# Patient Record
Sex: Male | Born: 1955 | Race: White | Hispanic: Yes | Marital: Married | Smoking: Never smoker
Health system: Southern US, Community
[De-identification: ages and names within clinical notes are randomized; demographics above are authoritative.]

## PROBLEM LIST (undated history)

## (undated) DIAGNOSIS — T7840XA Allergy, unspecified, initial encounter: Secondary | ICD-10-CM

## (undated) DIAGNOSIS — M7730 Calcaneal spur, unspecified foot: Secondary | ICD-10-CM

## (undated) DIAGNOSIS — G43909 Migraine, unspecified, not intractable, without status migrainosus: Secondary | ICD-10-CM

## (undated) DIAGNOSIS — Z860100 Personal history of colon polyps, unspecified: Secondary | ICD-10-CM

## (undated) DIAGNOSIS — M67919 Unspecified disorder of synovium and tendon, unspecified shoulder: Secondary | ICD-10-CM

## (undated) DIAGNOSIS — M719 Bursopathy, unspecified: Secondary | ICD-10-CM

## (undated) DIAGNOSIS — B351 Tinea unguium: Secondary | ICD-10-CM

## (undated) HISTORY — DX: Bursopathy, unspecified: M71.9

## (undated) HISTORY — DX: Tinea unguium: B35.1

## (undated) HISTORY — DX: Personal history of colonic polyps: Z86.010

## (undated) HISTORY — PX: TONSILLECTOMY AND ADENOIDECTOMY: SHX28

## (undated) HISTORY — DX: Migraine, unspecified, not intractable, without status migrainosus: G43.909

## (undated) HISTORY — DX: Allergy, unspecified, initial encounter: T78.40XA

## (undated) HISTORY — PX: HERNIA REPAIR: SHX51

## (undated) HISTORY — DX: Personal history of colon polyps, unspecified: Z86.0100

## (undated) HISTORY — DX: Unspecified disorder of synovium and tendon, unspecified shoulder: M67.919

## (undated) HISTORY — DX: Calcaneal spur, unspecified foot: M77.30

## (undated) HISTORY — PX: APPENDECTOMY: SHX54

## (undated) HISTORY — PX: VASECTOMY: SHX75

---

## 2003-03-14 ENCOUNTER — Ambulatory Visit (HOSPITAL_COMMUNITY): Admission: RE | Admit: 2003-03-14 | Discharge: 2003-03-14 | Payer: Self-pay | Admitting: Urology

## 2004-11-21 ENCOUNTER — Ambulatory Visit: Payer: Self-pay | Admitting: Internal Medicine

## 2004-11-30 ENCOUNTER — Ambulatory Visit: Payer: Self-pay | Admitting: Internal Medicine

## 2004-12-24 ENCOUNTER — Ambulatory Visit: Payer: Self-pay | Admitting: Internal Medicine

## 2004-12-25 ENCOUNTER — Ambulatory Visit (HOSPITAL_BASED_OUTPATIENT_CLINIC_OR_DEPARTMENT_OTHER): Admission: RE | Admit: 2004-12-25 | Discharge: 2004-12-25 | Payer: Self-pay | Admitting: Internal Medicine

## 2004-12-26 ENCOUNTER — Ambulatory Visit: Payer: Self-pay | Admitting: Pulmonary Disease

## 2005-01-03 ENCOUNTER — Ambulatory Visit: Payer: Self-pay | Admitting: Gastroenterology

## 2005-01-11 ENCOUNTER — Ambulatory Visit: Payer: Self-pay | Admitting: Internal Medicine

## 2005-01-17 ENCOUNTER — Ambulatory Visit: Payer: Self-pay | Admitting: Internal Medicine

## 2005-08-06 ENCOUNTER — Ambulatory Visit: Payer: Self-pay | Admitting: Internal Medicine

## 2005-08-16 ENCOUNTER — Ambulatory Visit: Payer: Self-pay | Admitting: Internal Medicine

## 2005-11-29 ENCOUNTER — Ambulatory Visit: Payer: Self-pay | Admitting: Internal Medicine

## 2006-06-26 ENCOUNTER — Ambulatory Visit: Payer: Self-pay | Admitting: Internal Medicine

## 2007-06-25 ENCOUNTER — Ambulatory Visit: Payer: Self-pay | Admitting: Internal Medicine

## 2007-06-25 LAB — CONVERTED CEMR LAB
ALT: 23 units/L (ref 0–53)
AST: 17 units/L (ref 0–37)
Albumin: 3.6 g/dL (ref 3.5–5.2)
Alkaline Phosphatase: 67 units/L (ref 39–117)
BUN: 14 mg/dL (ref 6–23)
Basophils Absolute: 0 10*3/uL (ref 0.0–0.1)
Basophils Relative: 0.4 % (ref 0.0–1.0)
Bilirubin Urine: NEGATIVE
Bilirubin, Direct: 0.2 mg/dL (ref 0.0–0.3)
CO2: 29 meq/L (ref 19–32)
Calcium: 9.1 mg/dL (ref 8.4–10.5)
Chloride: 105 meq/L (ref 96–112)
Cholesterol: 175 mg/dL (ref 0–200)
Creatinine, Ser: 1 mg/dL (ref 0.4–1.5)
Eosinophils Absolute: 0.1 10*3/uL (ref 0.0–0.6)
Eosinophils Relative: 1.9 % (ref 0.0–5.0)
GFR calc Af Amer: 101 mL/min
GFR calc non Af Amer: 83 mL/min
Glucose, Bld: 95 mg/dL (ref 70–99)
Glucose, Urine, Semiquant: NEGATIVE
HCT: 42.2 % (ref 39.0–52.0)
HDL: 26.1 mg/dL — ABNORMAL LOW (ref >39.0)
Hemoglobin: 14 g/dL (ref 13.0–17.0)
Ketones, urine, test strip: NEGATIVE
LDL Cholesterol: 109 mg/dL — ABNORMAL HIGH (ref 0–99)
Lymphocytes Relative: 34.8 % (ref 12.0–46.0)
MCHC: 33.3 g/dL (ref 30.0–36.0)
MCV: 85.4 fL (ref 78.0–100.0)
Monocytes Absolute: 0.5 10*3/uL (ref 0.2–0.7)
Monocytes Relative: 7.8 % (ref 3.0–11.0)
Neutro Abs: 3.8 10*3/uL (ref 1.4–7.7)
Neutrophils Relative %: 55.1 % (ref 43.0–77.0)
Nitrite: NEGATIVE
PSA: 1.78 ng/mL (ref 0.10–4.00)
Platelets: 185 10*3/uL (ref 150–400)
Potassium: 4.6 meq/L (ref 3.5–5.1)
Protein, U semiquant: NEGATIVE
RBC: 4.94 M/uL (ref 4.22–5.81)
RDW: 12.9 % (ref 11.5–14.6)
Sodium: 141 meq/L (ref 135–145)
Specific Gravity, Urine: 1.02
TSH: 0.71 microintl units/mL (ref 0.35–5.50)
Total Bilirubin: 1.1 mg/dL (ref 0.3–1.2)
Total CHOL/HDL Ratio: 6.7
Total Protein: 6.3 g/dL (ref 6.0–8.3)
Triglycerides: 199 mg/dL — ABNORMAL HIGH (ref 0–149)
Urobilinogen, UA: 0.2
VLDL: 40 mg/dL (ref 0–40)
WBC Urine, dipstick: NEGATIVE
WBC: 6.8 10*3/uL (ref 4.5–10.5)
pH: 6.5

## 2007-07-01 DIAGNOSIS — G4733 Obstructive sleep apnea (adult) (pediatric): Secondary | ICD-10-CM | POA: Insufficient documentation

## 2007-07-01 DIAGNOSIS — E785 Hyperlipidemia, unspecified: Secondary | ICD-10-CM

## 2007-07-01 DIAGNOSIS — Z86010 Personal history of colonic polyps: Secondary | ICD-10-CM

## 2007-07-01 DIAGNOSIS — J309 Allergic rhinitis, unspecified: Secondary | ICD-10-CM | POA: Insufficient documentation

## 2007-07-02 ENCOUNTER — Ambulatory Visit: Payer: Self-pay | Admitting: Internal Medicine

## 2007-07-29 ENCOUNTER — Ambulatory Visit: Payer: Self-pay | Admitting: Internal Medicine

## 2007-07-29 DIAGNOSIS — F988 Other specified behavioral and emotional disorders with onset usually occurring in childhood and adolescence: Secondary | ICD-10-CM

## 2007-07-29 DIAGNOSIS — M67919 Unspecified disorder of synovium and tendon, unspecified shoulder: Secondary | ICD-10-CM

## 2007-07-29 DIAGNOSIS — M719 Bursopathy, unspecified: Secondary | ICD-10-CM

## 2007-07-29 HISTORY — DX: Unspecified disorder of synovium and tendon, unspecified shoulder: M67.919

## 2007-09-02 ENCOUNTER — Telehealth (INDEPENDENT_AMBULATORY_CARE_PROVIDER_SITE_OTHER): Payer: Self-pay | Admitting: *Deleted

## 2007-11-03 ENCOUNTER — Ambulatory Visit: Payer: Self-pay | Admitting: Internal Medicine

## 2007-11-03 DIAGNOSIS — T887XXA Unspecified adverse effect of drug or medicament, initial encounter: Secondary | ICD-10-CM | POA: Insufficient documentation

## 2007-11-03 LAB — CONVERTED CEMR LAB
ALT: 27 units/L (ref 0–53)
AST: 20 units/L (ref 0–37)
Albumin: 3.5 g/dL (ref 3.5–5.2)
Alkaline Phosphatase: 74 units/L (ref 39–117)
Bilirubin, Direct: 0.1 mg/dL (ref 0.0–0.3)
Cholesterol: 198 mg/dL (ref 0–200)
Direct LDL: 128.1 mg/dL
HDL: 27.7 mg/dL — ABNORMAL LOW (ref >39.0)
Total Bilirubin: 0.8 mg/dL (ref 0.3–1.2)
Total CHOL/HDL Ratio: 7.1
Total Protein: 6.4 g/dL (ref 6.0–8.3)
Triglycerides: 234 mg/dL (ref 0–149)
VLDL: 47 mg/dL — ABNORMAL HIGH (ref 0–40)

## 2007-11-12 ENCOUNTER — Ambulatory Visit: Payer: Self-pay | Admitting: Internal Medicine

## 2007-11-12 DIAGNOSIS — B351 Tinea unguium: Secondary | ICD-10-CM

## 2007-11-12 HISTORY — DX: Tinea unguium: B35.1

## 2007-12-22 ENCOUNTER — Telehealth: Payer: Self-pay | Admitting: Internal Medicine

## 2008-02-04 ENCOUNTER — Ambulatory Visit: Payer: Self-pay | Admitting: Internal Medicine

## 2008-02-04 LAB — CONVERTED CEMR LAB
ALT: 26 units/L (ref 0–53)
AST: 18 units/L (ref 0–37)
Albumin: 3.7 g/dL (ref 3.5–5.2)
Alkaline Phosphatase: 67 units/L (ref 39–117)
Bilirubin, Direct: 0.1 mg/dL (ref 0.0–0.3)
Total Bilirubin: 0.8 mg/dL (ref 0.3–1.2)
Total Protein: 6.6 g/dL (ref 6.0–8.3)

## 2008-02-11 ENCOUNTER — Ambulatory Visit: Payer: Self-pay | Admitting: Internal Medicine

## 2008-02-11 DIAGNOSIS — E8881 Metabolic syndrome: Secondary | ICD-10-CM

## 2008-07-05 ENCOUNTER — Ambulatory Visit: Payer: Self-pay | Admitting: Internal Medicine

## 2008-07-05 LAB — CONVERTED CEMR LAB
ALT: 28 units/L (ref 0–53)
AST: 21 units/L (ref 0–37)
Albumin: 3.7 g/dL (ref 3.5–5.2)
Alkaline Phosphatase: 62 units/L (ref 39–117)
BUN: 20 mg/dL (ref 6–23)
Basophils Absolute: 0.1 10*3/uL (ref 0.0–0.1)
Basophils Relative: 0.8 % (ref 0.0–3.0)
Bilirubin Urine: NEGATIVE
Bilirubin, Direct: 0.1 mg/dL (ref 0.0–0.3)
CO2: 28 meq/L (ref 19–32)
Calcium: 9.2 mg/dL (ref 8.4–10.5)
Chloride: 104 meq/L (ref 96–112)
Cholesterol: 205 mg/dL (ref 0–200)
Creatinine, Ser: 1 mg/dL (ref 0.4–1.5)
Direct LDL: 130.1 mg/dL
Eosinophils Absolute: 0.1 10*3/uL (ref 0.0–0.7)
Eosinophils Relative: 1.3 % (ref 0.0–5.0)
GFR calc Af Amer: 100 mL/min
GFR calc non Af Amer: 83 mL/min
Glucose, Bld: 96 mg/dL (ref 70–99)
Glucose, Urine, Semiquant: NEGATIVE
HCT: 44 % (ref 39.0–52.0)
HDL: 32.4 mg/dL — ABNORMAL LOW (ref >39.0)
Hemoglobin: 14.9 g/dL (ref 13.0–17.0)
Ketones, urine, test strip: NEGATIVE
Lymphocytes Relative: 31.7 % (ref 12.0–46.0)
MCHC: 33.9 g/dL (ref 30.0–36.0)
MCV: 85.6 fL (ref 78.0–100.0)
Monocytes Absolute: 0.5 10*3/uL (ref 0.1–1.0)
Monocytes Relative: 6.3 % (ref 3.0–12.0)
Neutro Abs: 4.6 10*3/uL (ref 1.4–7.7)
Neutrophils Relative %: 59.9 % (ref 43.0–77.0)
Nitrite: NEGATIVE
PSA: 1.79 ng/mL (ref 0.10–4.00)
Platelets: 156 10*3/uL (ref 150–400)
Potassium: 4.1 meq/L (ref 3.5–5.1)
Protein, U semiquant: NEGATIVE
RBC: 5.13 M/uL (ref 4.22–5.81)
RDW: 13.2 % (ref 11.5–14.6)
Sodium: 138 meq/L (ref 135–145)
Specific Gravity, Urine: 1.025
TSH: 1.07 microintl units/mL (ref 0.35–5.50)
Total Bilirubin: 1.1 mg/dL (ref 0.3–1.2)
Total CHOL/HDL Ratio: 6.3
Total Protein: 6.8 g/dL (ref 6.0–8.3)
Triglycerides: 183 mg/dL — ABNORMAL HIGH (ref 0–149)
Urobilinogen, UA: 0.2
VLDL: 37 mg/dL (ref 0–40)
WBC Urine, dipstick: NEGATIVE
WBC: 7.7 10*3/uL (ref 4.5–10.5)
pH: 5.5

## 2008-07-12 ENCOUNTER — Ambulatory Visit: Payer: Self-pay | Admitting: Internal Medicine

## 2008-10-05 ENCOUNTER — Ambulatory Visit: Payer: Self-pay | Admitting: Internal Medicine

## 2008-10-05 LAB — CONVERTED CEMR LAB
ALT: 22 units/L (ref 0–53)
AST: 20 units/L (ref 0–37)
Albumin: 3.6 g/dL (ref 3.5–5.2)
Alkaline Phosphatase: 68 units/L (ref 39–117)
Bilirubin, Direct: 0 mg/dL (ref 0.0–0.3)
Cholesterol: 167 mg/dL (ref 0–200)
HDL: 35.2 mg/dL — ABNORMAL LOW (ref >39.00)
LDL Cholesterol: 104 mg/dL — ABNORMAL HIGH (ref 0–99)
Total Bilirubin: 1 mg/dL (ref 0.3–1.2)
Total CHOL/HDL Ratio: 5
Total Protein: 6.8 g/dL (ref 6.0–8.3)
Triglycerides: 140 mg/dL (ref 0.0–149.0)
VLDL: 28 mg/dL (ref 0.0–40.0)

## 2008-10-12 ENCOUNTER — Ambulatory Visit: Payer: Self-pay | Admitting: Internal Medicine

## 2008-10-12 LAB — CONVERTED CEMR LAB
Cholesterol, target level: 200 mg/dL
HDL goal, serum: 40 mg/dL
LDL Goal: 100 mg/dL

## 2008-10-21 ENCOUNTER — Telehealth (INDEPENDENT_AMBULATORY_CARE_PROVIDER_SITE_OTHER): Payer: Self-pay | Admitting: *Deleted

## 2009-01-09 DIAGNOSIS — E669 Obesity, unspecified: Secondary | ICD-10-CM

## 2009-01-09 DIAGNOSIS — E66812 Obesity, class 2: Secondary | ICD-10-CM | POA: Insufficient documentation

## 2009-02-14 ENCOUNTER — Ambulatory Visit: Payer: Self-pay | Admitting: Internal Medicine

## 2009-02-14 LAB — CONVERTED CEMR LAB
ALT: 23 units/L (ref 0–53)
AST: 18 units/L (ref 0–37)
Albumin: 3.7 g/dL (ref 3.5–5.2)
Alkaline Phosphatase: 77 units/L (ref 39–117)
Bilirubin, Direct: 0 mg/dL (ref 0.0–0.3)
Cholesterol: 172 mg/dL (ref 0–200)
HDL: 33.5 mg/dL — ABNORMAL LOW (ref >39.00)
LDL Cholesterol: 103 mg/dL — ABNORMAL HIGH (ref 0–99)
Total Bilirubin: 0.8 mg/dL (ref 0.3–1.2)
Total CHOL/HDL Ratio: 5
Total Protein: 7.3 g/dL (ref 6.0–8.3)
Triglycerides: 176 mg/dL — ABNORMAL HIGH (ref 0.0–149.0)
VLDL: 35.2 mg/dL (ref 0.0–40.0)

## 2009-02-21 ENCOUNTER — Ambulatory Visit: Payer: Self-pay | Admitting: Internal Medicine

## 2009-04-06 ENCOUNTER — Telehealth: Payer: Self-pay | Admitting: Internal Medicine

## 2009-06-19 ENCOUNTER — Ambulatory Visit: Payer: Self-pay | Admitting: Internal Medicine

## 2009-06-19 LAB — CONVERTED CEMR LAB
ALT: 26 units/L (ref 0–53)
AST: 23 units/L (ref 0–37)
Albumin: 3.6 g/dL (ref 3.5–5.2)
Alkaline Phosphatase: 72 units/L (ref 39–117)
Bilirubin, Direct: 0.1 mg/dL (ref 0.0–0.3)
Cholesterol: 128 mg/dL (ref 0–200)
HDL: 40 mg/dL (ref >39.00)
LDL Cholesterol: 59 mg/dL (ref 0–99)
Total Bilirubin: 0.6 mg/dL (ref 0.3–1.2)
Total CHOL/HDL Ratio: 3
Total Protein: 7 g/dL (ref 6.0–8.3)
Triglycerides: 147 mg/dL (ref 0.0–149.0)
VLDL: 29.4 mg/dL (ref 0.0–40.0)

## 2009-06-26 ENCOUNTER — Ambulatory Visit: Payer: Self-pay | Admitting: Internal Medicine

## 2009-10-31 ENCOUNTER — Telehealth: Payer: Self-pay | Admitting: *Deleted

## 2009-11-21 ENCOUNTER — Ambulatory Visit: Payer: Self-pay | Admitting: Internal Medicine

## 2009-11-21 LAB — CONVERTED CEMR LAB
ALT: 22 units/L (ref 0–53)
AST: 18 units/L (ref 0–37)
Albumin: 3.8 g/dL (ref 3.5–5.2)
Alkaline Phosphatase: 69 units/L (ref 39–117)
BUN: 16 mg/dL (ref 6–23)
Basophils Absolute: 0 10*3/uL (ref 0.0–0.1)
Basophils Relative: 0.4 % (ref 0.0–3.0)
Bilirubin Urine: NEGATIVE
Bilirubin, Direct: 0.2 mg/dL (ref 0.0–0.3)
CO2: 29 meq/L (ref 19–32)
Calcium: 8.8 mg/dL (ref 8.4–10.5)
Chloride: 106 meq/L (ref 96–112)
Cholesterol: 143 mg/dL (ref 0–200)
Creatinine, Ser: 1.1 mg/dL (ref 0.4–1.5)
Eosinophils Absolute: 0.1 10*3/uL (ref 0.0–0.7)
Eosinophils Relative: 1 % (ref 0.0–5.0)
GFR calc non Af Amer: 76.78 mL/min (ref >60)
Glucose, Bld: 101 mg/dL — ABNORMAL HIGH (ref 70–99)
Glucose, Urine, Semiquant: NEGATIVE
HCT: 40.9 % (ref 39.0–52.0)
HDL: 33.8 mg/dL — ABNORMAL LOW (ref >39.00)
Hemoglobin: 13.9 g/dL (ref 13.0–17.0)
Ketones, urine, test strip: NEGATIVE
LDL Cholesterol: 70 mg/dL (ref 0–99)
Lymphocytes Relative: 34.4 % (ref 12.0–46.0)
Lymphs Abs: 2.6 10*3/uL (ref 0.7–4.0)
MCHC: 34.2 g/dL (ref 30.0–36.0)
MCV: 87 fL (ref 78.0–100.0)
Monocytes Absolute: 0.5 10*3/uL (ref 0.1–1.0)
Monocytes Relative: 6.3 % (ref 3.0–12.0)
Neutro Abs: 4.4 10*3/uL (ref 1.4–7.7)
Neutrophils Relative %: 57.9 % (ref 43.0–77.0)
Nitrite: NEGATIVE
PSA: 1.95 ng/mL (ref 0.10–4.00)
Platelets: 170 10*3/uL (ref 150.0–400.0)
Potassium: 4.4 meq/L (ref 3.5–5.1)
Protein, U semiquant: NEGATIVE
RBC: 4.7 M/uL (ref 4.22–5.81)
RDW: 13.3 % (ref 11.5–14.6)
Sodium: 139 meq/L (ref 135–145)
Specific Gravity, Urine: 1.025
TSH: 0.94 microintl units/mL (ref 0.35–5.50)
Total Bilirubin: 0.9 mg/dL (ref 0.3–1.2)
Total CHOL/HDL Ratio: 4
Total Protein: 6.6 g/dL (ref 6.0–8.3)
Triglycerides: 198 mg/dL — ABNORMAL HIGH (ref 0.0–149.0)
Urobilinogen, UA: 0.2
VLDL: 39.6 mg/dL (ref 0.0–40.0)
WBC Urine, dipstick: NEGATIVE
WBC: 7.7 10*3/uL (ref 4.5–10.5)
pH: 5.5

## 2009-12-11 ENCOUNTER — Ambulatory Visit: Payer: Self-pay | Admitting: Internal Medicine

## 2010-03-20 ENCOUNTER — Ambulatory Visit: Payer: Self-pay | Admitting: Internal Medicine

## 2010-03-20 DIAGNOSIS — M7730 Calcaneal spur, unspecified foot: Secondary | ICD-10-CM

## 2010-03-20 HISTORY — DX: Calcaneal spur, unspecified foot: M77.30

## 2010-05-19 ENCOUNTER — Encounter: Payer: Self-pay | Admitting: Internal Medicine

## 2010-05-27 LAB — CONVERTED CEMR LAB
Cholesterol, target level: 200 mg/dL
HDL goal, serum: 40 mg/dL
LDL Goal: 130 mg/dL

## 2010-05-29 NOTE — Progress Notes (Signed)
Summary: REFILL REQUEST  Phone Note Refill Request Message from:  Patient on October 31, 2009 4:57 PM  Refills Requested: Medication #1:  ADDERALL XR 20 MG  CP24 one by mouth q AM   Notes: Pt can be reached at [phone]   or   [phone].... Pt would like to have 3 mth / 90-days Rx.    Initial call taken by: Debbra Riding,  October 31, 2009 4:59 PM  Follow-up for Phone Call        may refill Follow-up by: Stacie Glaze MD,  November 01, 2009 8:04 AM

## 2010-05-29 NOTE — Assessment & Plan Note (Signed)
Summary: CPX/NJR [facility] BMP/NJR   Vital Signs:  Patient profile:   55 year old male Height:      72 inches Weight:      274 pounds BMI:     37.30 Temp:     98.2 degrees F oral Pulse rate:   76 / minute Resp:     14 per minute BP sitting:   136 / 80  (left arm)  Vitals Entered By: Willy Eddy, LPN (December 11, 2009 3:10 PM)  Nutrition Counseling: Patient's BMI is greater than 25 and therefore counseled on weight management options. CC: cpx- Is Patient Diabetic? No   CC:  cpx-.  History of Present Illness: The pt was asked about all immunizations, health maint. services that are appropriate to their age and was given guidance on diet exercize  and weight management   Preventive Screening-Counseling & Management  Alcohol-Tobacco     Smoking Status: never  Problems Prior to Update: 1)  Morbid Obesity  (ICD-278.01) 2)  Dysmetabolic Syndrome  (ICD-277.7) 3)  Onychomycosis  (ICD-110.1) 4)  Uns Advrs Eff Uns Rx Medicinal&biological Sbstnc  (ICD-995.20) 5)  Add  (ICD-314.00) 6)  Rotator Cuff Injury, Left Shoulder  (ICD-726.10) 7)  Physical Examination  (ICD-V70.0) 8)  Colonic Polyps, Hx of  (ICD-V12.72) 9)  Sleep Apnea, Obstructive, Mild  (ICD-327.23) 10)  Hyperlipidemia  (ICD-272.4) 11)  Allergic Rhinitis  (ICD-477.9) 12)  Family History Diabetes 1st Degree Relative  (ICD-V18.0) 13)  Family History of Colon Ca 1st Degree Relative <60  (ICD-V16.0)  Current Problems (verified): 1)  Morbid Obesity  (ICD-278.01) 2)  Dysmetabolic Syndrome  (ICD-277.7) 3)  Onychomycosis  (ICD-110.1) 4)  Uns Advrs Eff Uns Rx Medicinal&biological Sbstnc  (ICD-995.20) 5)  Add  (ICD-314.00) 6)  Rotator Cuff Injury, Left Shoulder  (ICD-726.10) 7)  Physical Examination  (ICD-V70.0) 8)  Colonic Polyps, Hx of  (ICD-V12.72) 9)  Sleep Apnea, Obstructive, Mild  (ICD-327.23) 10)  Hyperlipidemia  (ICD-272.4) 11)  Allergic Rhinitis  (ICD-477.9) 12)  Family History Diabetes 1st Degree Relative   (ICD-V18.0) 13)  Family History of Colon Ca 1st Degree Relative <60  (ICD-V16.0)  Medications Prior to Update: 1)  Advil 200 Mg Tabs (Ibuprofen) .... One By Mouth Prn 2)  Adderall Xr 20 Mg  Cp24 (Amphetamine-Dextroamphetamine) .... One By Mouth Q Am 3)  Crestor 10 Mg Tabs (Rosuvastatin Calcium) .... One By Mouth Daily  Current Medications (verified): 1)  Advil 200 Mg Tabs (Ibuprofen) .... One By Mouth Prn 2)  Adderall Xr 20 Mg  Cp24 (Amphetamine-Dextroamphetamine) .... One By Mouth Q Am 3)  Crestor 10 Mg Tabs (Rosuvastatin Calcium) .... One By Mouth Daily  Allergies (verified): No Known Drug Allergies  Past History:  Family History: Last updated: 01/09/2007 Family History of Alzheimers Family History of Anxiety Family History of Colon CA 1st degree relative <60 Family History Diabetes 1st degree relative Family History Hypertension Family History of Cardiovascular disorder  Social History: Last updated: 01/09/2007 Occupation: Married Never Smoked Alcohol use-yes Drug use-no  Risk Factors: Smoking Status: never (12/11/2009)  Past medical, surgical, family and social histories (including risk factors) reviewed, and no changes noted (except as noted below).  Past Medical History: Reviewed history from 07/01/2007 and no changes required. Allergies Allergic rhinitis Hyperlipidemia Colonic polyps, hx of  Past Surgical History: Reviewed history from 01/09/2007 and no changes required. Appendectomy Inguinal herniorrhaphy Vasectomy  Family History: Reviewed history from 01/09/2007 and no changes required. Family History of Alzheimers Family History of Anxiety Family History  of Colon CA 1st degree relative <60 Family History Diabetes 1st degree relative Family History Hypertension Family History of Cardiovascular disorder  Social History: Reviewed history from 01/09/2007 and no changes required. Occupation: Married Never Smoked Alcohol use-yes Drug  use-no  Review of Systems  The patient denies anorexia, fever, weight loss, weight gain, vision loss, decreased hearing, hoarseness, chest pain, syncope, dyspnea on exertion, peripheral edema, prolonged cough, headaches, hemoptysis, abdominal pain, melena, hematochezia, severe indigestion/heartburn, hematuria, incontinence, genital sores, muscle weakness, suspicious skin lesions, transient blindness, difficulty walking, depression, unusual weight change, abnormal bleeding, enlarged lymph nodes, angioedema, and breast masses.    Physical Exam  General:  alert and overweight-appearing.   Head:  normocephalic and male-pattern balding.   Eyes:  pupils equal and pupils round.   Ears:  R ear normal and L ear normal.   Nose:  no external deformity and no nasal discharge.   Mouth:  good dentition and pharynx pink and moist.   Neck:  No deformities, masses, or tenderness noted. Lungs:  normal respiratory effort and no wheezes.   Heart:  normal rate and regular rhythm.   Abdomen:  soft and non-tender.   Msk:  normal ROM and no joint tenderness.   Extremities:  trace left pedal edema and trace right pedal edema.   Neurologic:  alert & oriented X3 and DTRs symmetrical and normal.     Impression & Recommendations:  Problem # 1:  PHYSICAL EXAMINATION (ICD-V70.0) The pt was asked about all immunizations, health maint. services that are appropriate to their age and was given guidance on diet exercize  and weight management  Colonoscopy: repeat in sept of 2009 (01/03/2005) Td Booster: Tdap (07/02/2007)   Flu Vax: Historical (01/27/2009)   Chol: 143 (11/21/2009)   HDL: 33.80 (11/21/2009)   LDL: 70 (11/21/2009)   TG: 198.0 (11/21/2009) TSH: 0.94 (11/21/2009)   PSA: 1.95 (11/21/2009)  Discussed using sunscreen, use of alcohol, drug use, self testicular exam, routine dental care, routine eye care, routine physical exam, seat belts, multiple vitamins, osteoporosis prevention, adequate calcium intake in  diet, and recommendations for immunizations.  Discussed exercise and checking cholesterol.  Discussed gun safety, safe sex, and contraception. Also recommend checking PSA.  Problem # 2:  ADD (ICD-314.00) refilol the adderal  Complete Medication List: 1)  Advil 200 Mg Tabs (Ibuprofen) .... One by mouth prn 2)  Adderall Xr 20 Mg Cp24 (Amphetamine-dextroamphetamine) .... One by mouth q am 3)  Crestor 10 Mg Tabs (Rosuvastatin calcium) .... One by mouth daily  Patient Instructions: 1)  Please schedule a follow-up appointment in 6 months. 2)  [facility] tinn[facility] Prescriptions: ADDERALL XR 20 MG  CP24 (AMPHETAMINE-DEXTROAMPHETAMINE) one by mouth q AM  #90 x 0   Entered and Authorized by:   Stacie Glaze MD   Signed by:   Stacie Glaze MD on 12/11/2009   Method used:   Print then Give to Patient   RxID:   [id number]

## 2010-05-29 NOTE — Assessment & Plan Note (Signed)
Summary: ROA X 3 MTHS / RS/PT RESCD//CCM/PT RESCD//CCM   Vital Signs:  Patient profile:   55 year old male Height:      72 inches Weight:      272 pounds BMI:     37.02 Temp:     98.2 degrees F oral Pulse rate:   76 / minute Resp:     14 per minute BP sitting:   140 / 80  (left arm)  Vitals Entered By: Willy Eddy, LPN (June 26, 2009 4:45 PM) CC: roa labs   CC:  roa labs.  History of Present Illness:  Hyperlipidemia Follow-Up      This is a 55 year old man who presents for Hyperlipidemia follow-up.  The patient denies muscle aches, GI upset, abdominal pain, flushing, itching, constipation, diarrhea, and fatigue.  The patient denies the following symptoms: chest pain/pressure, exercise intolerance, dypsnea, palpitations, syncope, and pedal edema.  Compliance with medications (by patient report) has been near 100%.  Dietary compliance has been good.  The patient reports exercising occasionally.  Adjunctive measures currently used by the patient include fiber, ASA, fish oil supplements, and weight reduction.    Preventive Screening-Counseling & Management  Alcohol-Tobacco     Smoking Status: never  Problems Prior to Update: 1)  Morbid Obesity  (ICD-278.01) 2)  Dysmetabolic Syndrome  (ICD-277.7) 3)  Onychomycosis  (ICD-110.1) 4)  Uns Advrs Eff Uns Rx Medicinal&biological Sbstnc  (ICD-995.20) 5)  Add  (ICD-314.00) 6)  Rotator Cuff Injury, Left Shoulder  (ICD-726.10) 7)  Physical Examination  (ICD-V70.0) 8)  Colonic Polyps, Hx of  (ICD-V12.72) 9)  Sleep Apnea, Obstructive, Mild  (ICD-327.23) 10)  Hyperlipidemia  (ICD-272.4) 11)  Allergic Rhinitis  (ICD-477.9) 12)  Family History Diabetes 1st Degree Relative  (ICD-V18.0) 13)  Family History of Colon Ca 1st Degree Relative <60  (ICD-V16.0)  Current Problems (verified): 1)  Morbid Obesity  (ICD-278.01) 2)  Dysmetabolic Syndrome  (ICD-277.7) 3)  Onychomycosis  (ICD-110.1) 4)  Uns Advrs Eff Uns Rx Medicinal&biological  Sbstnc  (ICD-995.20) 5)  Add  (ICD-314.00) 6)  Rotator Cuff Injury, Left Shoulder  (ICD-726.10) 7)  Physical Examination  (ICD-V70.0) 8)  Colonic Polyps, Hx of  (ICD-V12.72) 9)  Sleep Apnea, Obstructive, Mild  (ICD-327.23) 10)  Hyperlipidemia  (ICD-272.4) 11)  Allergic Rhinitis  (ICD-477.9) 12)  Family History Diabetes 1st Degree Relative  (ICD-V18.0) 13)  Family History of Colon Ca 1st Degree Relative <60  (ICD-V16.0)  Medications Prior to Update: 1)  Advil 200 Mg Tabs (Ibuprofen) .... One By Mouth Prn 2)  Adderall Xr 20 Mg  Cp24 (Amphetamine-Dextroamphetamine) .... One By Mouth Q Am 3)  Crestor 10 Mg Tabs (Rosuvastatin Calcium) .... One By Mouth Daily 4)  Zithromax Z-Pak 250 Mg Tabs (Azithromycin) .... One By Mouth Daily  Current Medications (verified): 1)  Advil 200 Mg Tabs (Ibuprofen) .... One By Mouth Prn 2)  Adderall Xr 20 Mg  Cp24 (Amphetamine-Dextroamphetamine) .... One By Mouth Q Am 3)  Crestor 10 Mg Tabs (Rosuvastatin Calcium) .... One By Mouth Daily  Allergies (verified): No Known Drug Allergies  Past History:  Family History: Last updated: 01/09/2007 Family History of Alzheimers Family History of Anxiety Family History of Colon CA 1st degree relative <60 Family History Diabetes 1st degree relative Family History Hypertension Family History of Cardiovascular disorder  Social History: Last updated: 01/09/2007 Occupation: Married Never Smoked Alcohol use-yes Drug use-no  Risk Factors: Smoking Status: never (06/26/2009)  Past medical, surgical, family and social histories (including risk  factors) reviewed, and no changes noted (except as noted below).  Past Medical History: Reviewed history from 07/01/2007 and no changes required. Allergies Allergic rhinitis Hyperlipidemia Colonic polyps, hx of  Past Surgical History: Reviewed history from 01/09/2007 and no changes required. Appendectomy Inguinal herniorrhaphy Vasectomy  Family History: Reviewed  history from 01/09/2007 and no changes required. Family History of Alzheimers Family History of Anxiety Family History of Colon CA 1st degree relative <60 Family History Diabetes 1st degree relative Family History Hypertension Family History of Cardiovascular disorder  Social History: Reviewed history from 01/09/2007 and no changes required. Occupation: Married Never Smoked Alcohol use-yes Drug use-no  Review of Systems       The patient complains of weight gain.  The patient denies anorexia, fever, weight loss, vision loss, decreased hearing, hoarseness, chest pain, syncope, dyspnea on exertion, peripheral edema, prolonged cough, headaches, hemoptysis, abdominal pain, melena, hematochezia, severe indigestion/heartburn, hematuria, incontinence, genital sores, muscle weakness, suspicious skin lesions, transient blindness, difficulty walking, depression, unusual weight change, abnormal bleeding, enlarged lymph nodes, angioedema, breast masses, and testicular masses.    Physical Exam  General:  alert and overweight-appearing.   Head:  normocephalic and male-pattern balding.   Eyes:  pupils equal and pupils round.   Ears:  R ear normal and L ear normal.   Nose:  no external deformity and no nasal discharge.   Mouth:  good dentition and pharynx pink and moist.   Neck:  No deformities, masses, or tenderness noted. Lungs:  normal respiratory effort and no wheezes.   Heart:  normal rate and regular rhythm.   Abdomen:  soft and non-tender.   Neurologic:  alert & oriented X3 and DTRs symmetrical and normal.     Impression & Recommendations:  Problem # 1:  HYPERLIPIDEMIA (ICD-272.4) Assessment New  His updated medication list for this problem includes:    Crestor 10 Mg Tabs (Rosuvastatin calcium) ..... One by mouth daily  Labs Reviewed: SGOT: 23 (06/19/2009)   SGPT: 26 (06/19/2009)  Lipid Goals: Chol Goal: 200 (10/12/2008)   HDL Goal: 40 (10/12/2008)   LDL Goal: 100 (10/12/2008)    TG Goal: 150 (10/12/2008)  Prior 10 Yr Risk Heart Disease: 14 % (10/12/2008)   HDL:40.00 (06/19/2009), 33.50 (02/14/2009)  LDL:59 (06/19/2009), 103 (16/01/9603)  Chol:128 (06/19/2009), 172 (02/14/2009)  Trig:147.0 (06/19/2009), 176.0 (02/14/2009)  Problem # 2:  MORBID OBESITY (ICD-278.01)  Ht: 72 (06/26/2009)   Wt: 272 (06/26/2009)   BMI: 37.02 (06/26/2009)  Problem # 3:  DYSMETABOLIC SYNDROME (ICD-277.7) weight loss and htn control and diet counsiling  Complete Medication List: 1)  Advil 200 Mg Tabs (Ibuprofen) .... One by mouth prn 2)  Adderall Xr 20 Mg Cp24 (Amphetamine-dextroamphetamine) .... One by mouth q am 3)  Crestor 10 Mg Tabs (Rosuvastatin calcium) .... One by mouth daily  Patient Instructions: 1)  Please schedule a follow-up appointment in 6 months.  CPX Prescriptions: CRESTOR 10 MG TABS (ROSUVASTATIN CALCIUM) one by mouth daily  #90 x 3   Entered and Authorized by:   Stacie Glaze MD   Signed by:   Stacie Glaze MD on 06/26/2009   Method used:   Electronically to        [facility]* (mail-order)             ,          Ph: [phone]       Fax: [phone]   RxID:   [id number]

## 2010-05-29 NOTE — Assessment & Plan Note (Signed)
Summary: ANKLE PAIN / RS   Vital Signs:  Patient profile:   55 year old male Height:      72 inches Weight:      274 pounds BMI:     37.30 Temp:     98.2 degrees F oral Pulse rate:   76 / minute Resp:     14 per minute BP sitting:   140 / 84  (left arm)  Vitals Entered By: Willy Eddy, LPN (March 20, 2010 4:16 PM) CC: c/o left heel pain on and off for about 1 month Is Patient Diabetic? No   Primary Care Provider:  Stacie Glaze MD  CC:  c/o left heel pain on and off for about 1 month.  History of Present Illness: foot pain that may have started after an acute injury  Preventive Screening-Counseling & Management  Alcohol-Tobacco     Smoking Status: never  Problems Prior to Update: 1)  Morbid Obesity  (ICD-278.01) 2)  Dysmetabolic Syndrome  (ICD-277.7) 3)  Onychomycosis  (ICD-110.1) 4)  Uns Advrs Eff Uns Rx Medicinal&biological Sbstnc  (ICD-995.20) 5)  Add  (ICD-314.00) 6)  Rotator Cuff Injury, Left Shoulder  (ICD-726.10) 7)  Physical Examination  (ICD-V70.0) 8)  Colonic Polyps, Hx of  (ICD-V12.72) 9)  Sleep Apnea, Obstructive, Mild  (ICD-327.23) 10)  Hyperlipidemia  (ICD-272.4) 11)  Allergic Rhinitis  (ICD-477.9) 12)  Family History Diabetes 1st Degree Relative  (ICD-V18.0) 13)  Family History of Colon Ca 1st Degree Relative <60  (ICD-V16.0)  Medications Prior to Update: 1)  Advil 200 Mg Tabs (Ibuprofen) .... One By Mouth Prn 2)  Adderall Xr 20 Mg  Cp24 (Amphetamine-Dextroamphetamine) .... One By Mouth Q Am 3)  Crestor 10 Mg Tabs (Rosuvastatin Calcium) .... One By Mouth Daily  Current Medications (verified): 1)  Advil 200 Mg Tabs (Ibuprofen) .... One By Mouth Prn 2)  Adderall Xr 20 Mg  Cp24 (Amphetamine-Dextroamphetamine) .... One By Mouth Q Am 3)  Crestor 10 Mg Tabs (Rosuvastatin Calcium) .... One By Mouth Daily  Allergies (verified): No Known Drug Allergies  Past History:  Family History: Last updated: 01/09/2007 Family History of  Alzheimers Family History of Anxiety Family History of Colon CA 1st degree relative <60 Family History Diabetes 1st degree relative Family History Hypertension Family History of Cardiovascular disorder  Social History: Last updated: 01/09/2007 Occupation: Married Never Smoked Alcohol use-yes Drug use-no  Risk Factors: Smoking Status: never (03/20/2010)  Past medical, surgical, family and social histories (including risk factors) reviewed, and no changes noted (except as noted below).  Past Medical History: Reviewed history from 07/01/2007 and no changes required. Allergies Allergic rhinitis Hyperlipidemia Colonic polyps, hx of  Past Surgical History: Reviewed history from 01/09/2007 and no changes required. Appendectomy Inguinal herniorrhaphy Vasectomy  Family History: Reviewed history from 01/09/2007 and no changes required. Family History of Alzheimers Family History of Anxiety Family History of Colon CA 1st degree relative <60 Family History Diabetes 1st degree relative Family History Hypertension Family History of Cardiovascular disorder  Social History: Reviewed history from 01/09/2007 and no changes required. Occupation: Married Never Smoked Alcohol use-yes Drug use-no  Review of Systems  The patient denies anorexia, fever, weight loss, weight gain, vision loss, decreased hearing, hoarseness, chest pain, syncope, dyspnea on exertion, peripheral edema, prolonged cough, headaches, hemoptysis, abdominal pain, melena, hematochezia, severe indigestion/heartburn, hematuria, incontinence, genital sores, muscle weakness, suspicious skin lesions, transient blindness, difficulty walking, depression, unusual weight change, abnormal bleeding, enlarged lymph nodes, angioedema, and breast masses.  Physical Exam  General:  alert and overweight-appearing.   Head:  normocephalic and male-pattern balding.   Eyes:  pupils equal and pupils round.   Ears:  R ear normal  and L ear normal.   Nose:  no external deformity and no nasal discharge.   Mouth:  good dentition and pharynx pink and moist.   Neck:  No deformities, masses, or tenderness noted. Lungs:  normal respiratory effort and no wheezes.   Heart:  normal rate and regular rhythm.   Abdomen:  soft and non-tender.   Msk:  normal ROM and no joint tenderness.   Extremities:  trace left pedal edema and trace right pedal edema.   Neurologic:  alert & oriented X3 and DTRs symmetrical and normal.     Impression & Recommendations:  Problem # 1:  MORBID OBESITY (ICD-278.01)  Ht: 72 (03/20/2010)   Wt: 274 (03/20/2010)   BMI: 37.30 (03/20/2010)  Problem # 2:  CALCANEAL SPUR, RIGHT (ICD-726.73)  Informed consent obtained and then the  right achilles tendon  was prepped in a sterile manor and 40 mg depo and 1/2 cc 1% lidocaine injected into the synovial space. After care discussed. Pt tolerated procedure well.  Orders: Trigger Point Injection Single Tendon Origin/Insertion [id number]) Depo-Medrol 20mg  (J1020)  Complete Medication List: 1)  Advil 200 Mg Tabs (Ibuprofen) .... One by mouth prn 2)  Adderall Xr 20 Mg Cp24 (Amphetamine-dextroamphetamine) .... One by mouth q am 3)  Crestor 10 Mg Tabs (Rosuvastatin calcium) .... One by mouth daily   Orders Added: 1)  Trigger Point Injection Single Tendon Origin/Insertion [20551] 2)  Depo-Medrol 20mg  [J1020] 3)  Est. Patient Level III [60454]

## 2010-06-13 ENCOUNTER — Encounter (INDEPENDENT_AMBULATORY_CARE_PROVIDER_SITE_OTHER): Payer: Self-pay | Admitting: *Deleted

## 2010-06-15 ENCOUNTER — Encounter: Payer: Self-pay | Admitting: Internal Medicine

## 2010-06-15 ENCOUNTER — Ambulatory Visit (INDEPENDENT_AMBULATORY_CARE_PROVIDER_SITE_OTHER): Payer: BC Managed Care – PPO | Admitting: Internal Medicine

## 2010-06-15 VITALS — BP 130/80 | HR 76 | Temp 98.2°F | Resp 14 | Ht 71.0 in | Wt 272.0 lb

## 2010-06-15 DIAGNOSIS — Z86010 Personal history of colonic polyps: Secondary | ICD-10-CM

## 2010-06-15 DIAGNOSIS — F988 Other specified behavioral and emotional disorders with onset usually occurring in childhood and adolescence: Secondary | ICD-10-CM

## 2010-06-15 DIAGNOSIS — B351 Tinea unguium: Secondary | ICD-10-CM

## 2010-06-15 DIAGNOSIS — G4733 Obstructive sleep apnea (adult) (pediatric): Secondary | ICD-10-CM

## 2010-06-15 DIAGNOSIS — Z0000 Encounter for general adult medical examination without abnormal findings: Secondary | ICD-10-CM

## 2010-06-15 DIAGNOSIS — K029 Dental caries, unspecified: Secondary | ICD-10-CM | POA: Insufficient documentation

## 2010-06-15 DIAGNOSIS — E785 Hyperlipidemia, unspecified: Secondary | ICD-10-CM

## 2010-06-15 MED ORDER — TERBINAFINE HCL 250 MG PO TABS: 250.0000 mg | ORAL_TABLET | Freq: Every day | ORAL | Status: AC

## 2010-06-15 NOTE — Assessment & Plan Note (Signed)
The pt had a dental abcess and was treated with an antibiotic

## 2010-06-15 NOTE — Progress Notes (Signed)
  Subjective:    Patient ID: Jermaine Johnson, male    DOB: 02-06-1956, 55 y.o.   MRN: [id number]  HPI patient is a 55 year old white male who presents for followup of chronic medical problems including hyperlipidemia allergic rhinitis morbid obesity and this metabolic syndrome he also presents for his adult  Attention deficit disorder.   His ADD medicine has been refilled prior to this office visit is due at this time his cholesterol is due to measurement but he does also do a physical so we will schedule both.   His main complaint today is painful great toenails both do to him for now and fungal nail.  He is tried and failed topical fungal nail treatments therefore we will prescribe Lamisil 250 daily for 90 days he'll be monitored as physical as to the success of this treatment and whether or not a repeat treatment would be indicated. He had been losing weight a regular basis and he has plateaued we discussed strategies for weight loss including exercise and    Review of Systems  Constitutional: Negative for fever and fatigue.  HENT: Negative for hearing loss, congestion, neck pain and postnasal drip.   Eyes: Negative for discharge, redness and visual disturbance.  Respiratory: Negative for cough, shortness of breath and wheezing.   Cardiovascular: Negative for leg swelling.  Gastrointestinal: Negative for abdominal pain, constipation and abdominal distention.  Genitourinary: Negative for urgency and frequency.  Musculoskeletal: Negative for joint swelling and arthralgias.  Skin: Negative for color change and rash.  Neurological: Negative for weakness and light-headedness.  Hematological: Negative for adenopathy.  Psychiatric/Behavioral: Negative for behavioral problems.       Past Medical History  Diagnosis Date  . Allergy   . Hyperlipidemia   . History of colonoscopy with polypectomy    Past Surgical History  Procedure Date  . Appendectomy   . Vasectomy   . Hernia repair     reports that he has been smoking.  He has never used smokeless tobacco. He reports that he drinks about 2.4 ounces of alcohol per week. He reports that he does not use illicit drugs. family history includes Alzheimer's disease in his father; Diabetes in his father; Heart disease in his mother; and Hyperlipidemia in his mother.  I have reviewed this patient's past medical history surgical history social history current medication list and current allergy list and have documented appropriate changes as necessary     Objective:   Physical Exam  Constitutional: He is oriented to person, place, and time. He appears well-developed and well-nourished.        Morbidly obese white male in no apparent distress  HENT:  Head: Normocephalic and atraumatic.  Eyes: Conjunctivae are normal. Pupils are equal, round, and reactive to light.  Neck: Normal range of motion. Neck supple.  Cardiovascular: Normal rate and regular rhythm.   Pulmonary/Chest: Effort normal and breath sounds normal.  Abdominal: Soft. Bowel sounds are normal.  Musculoskeletal: Normal range of motion.  Neurological: He is alert and oriented to person, place, and time.  Skin:        Bilateral fungal nails worse on the great toe bilaterally          Assessment & Plan:

## 2010-06-15 NOTE — Assessment & Plan Note (Signed)
[facility] has a prescription for a 90 day supply of his 80 medications and we'll send off to the pharmacy no prescription is required at this time he is stable on the current dose.

## 2010-06-15 NOTE — Assessment & Plan Note (Signed)
Weight is stable he is actually lost 2 pounds since his last last office visit he is still not at his lowest I level which was 270 pounds we will reinforce weight loss exercise especially as the warmer weather approaches and he can get out and do walking this is a necessary intervention for his chronic problems including his cholesterol as well as weight as well as cardiovascular risk factors

## 2010-06-15 NOTE — Assessment & Plan Note (Signed)
Has an appointment for a colonoscopy with Dr. Leone Payor

## 2010-06-15 NOTE — Assessment & Plan Note (Signed)
Lipid panel is due at this time we will schedule that at his physical in August. Continue the Crestor at 10 mg by mouth daily samples will be given today

## 2010-06-15 NOTE — Assessment & Plan Note (Signed)
Topical  Therapy has failed the patient agrees to try Lamisil for a 90 day

## 2010-06-15 NOTE — Assessment & Plan Note (Signed)
Weight loss is the primary intervention

## 2010-06-20 NOTE — Letter (Signed)
Summary: Pre Visit Letter Revised  [facility]  [address] [place], [place] [address]   Phone: [phone]  Fax: [phone]        06/13/2010 MRN: [id number] Jermaine Johnson [address] [place], [place]  [address]             Procedure Date: 07/24/2010 @ 10:30   recall colon-Dr. Russella Dar   Welcome to the Gastroenterology Division at [facility].    You are scheduled to see a nurse for your pre-procedure visit on 07/16/2010 at 8:00 on the 3rd floor at [facility], [address]  We ask that you try to arrive at our office 15 minutes prior to your appointment time to allow for check-in.  Please take a minute to review the attached form.  If you answer "Yes" to one or more of the questions on the first page, we ask that you call the person listed at your earliest opportunity.  If you answer "No" to all of the questions, please complete the rest of the form and bring it to your appointment.    Your nurse visit will consist of discussing your medical and surgical history, your immediate family medical history, and your medications.   If you are unable to list all of your medications on the form, please bring the medication bottles to your appointment and we will list them.  We will need to be aware of both prescribed and over the counter drugs.  We will need to know exact dosage information as well.    Please be prepared to read and sign documents such as consent forms, a financial agreement, and acknowledgement forms.  If necessary, and with your consent, a friend or relative is welcome to sit-in on the nurse visit with you.  Please bring your insurance card so that we may make a copy of it.  If your insurance requires a referral to see a specialist, please bring your referral form from your primary care physician.  No co-pay is required for this nurse visit.     If you cannot keep your appointment, please call [phone] to cancel or reschedule prior to  your appointment date.  This allows [place] the opportunity to schedule an appointment for another patient in need of care.    Thank you for choosing [facility] for your medical needs.  We appreciate the opportunity to care for you.  Please visit [place] at our website  to learn more about our practice.  Sincerely, The Gastroenterology Division

## 2010-07-24 ENCOUNTER — Other Ambulatory Visit: Payer: Self-pay | Admitting: Gastroenterology

## 2010-09-14 NOTE — Procedures (Signed)
NAMELEMONTE, AL NO.:  [id number]   MEDICAL RECORD NO.:  [id number]          PATIENT TYPE:  OUT   LOCATION:  SLEEP CENTER                 FACILITY:  [facility]   PHYSICIAN:  Marcelyn Bruins, M.D. [facility] DATE OF BIRTH:  [birth year]   DATE OF STUDY:  12/25/2004                              NOCTURNAL POLYSOMNOGRAM   REFERRING PHYSICIAN:  Dr. Darryll Capers.   DATE OF STUDY:  December 25, 2004.   INDICATION FOR STUDY:  Hypersomnia with sleep apnea. Epworth score: 13.   SLEEP ARCHITECTURE:  The patient total sleep time of 310 minutes with  decreased REM and slow wave sleep. Sleep onset latency was normal as was REM  onset. Sleep efficiency was 81%.   IMPRESSION:  1.  Split night study reveals mild obstructive sleep apnea/hypopnea syndrome      with 32 obstructive events noted in the first 131 minutes of sleep. This      gave the patient a respiratory disturbance index of 15 events per hour      with a O2 desaturation as low as 86%. Events were not positional but      they were worse during REM. There was moderate snoring noted. By      protocol, the patient was then placed on a medium Respironics comfort      nasal mask and was ultimately titrated to a final pressure of 9 cm with      good control of both snoring and obstructive events. It should be noted,      however, that mild obstructive sleep apnea can be treated with weight      loss alone, oral appliance, or possibly upper airway surgery. Clinical      correlation is suggested.  2.  Rare premature ventricular contractures noted.           ______________________________  Marcelyn Bruins, M.D. [facility]  Diplomate, American Board of Sleep  Medicine     KC/MEDQ  D:  12/27/2004 15:51:36  T:  12/27/2004 22:34:21  Job:  [id number]

## 2010-10-24 ENCOUNTER — Emergency Department (HOSPITAL_COMMUNITY): Payer: BC Managed Care – PPO

## 2010-10-24 ENCOUNTER — Emergency Department (HOSPITAL_COMMUNITY)
Admission: EM | Admit: 2010-10-24 | Discharge: 2010-10-25 | Disposition: A | Discharge status: Home / Self Care | Payer: BC Managed Care – PPO | Attending: Emergency Medicine | Admitting: Emergency Medicine

## 2010-10-24 DIAGNOSIS — Y93H9 Activity, other involving exterior property and land maintenance, building and construction: Secondary | ICD-10-CM | POA: Insufficient documentation

## 2010-10-24 DIAGNOSIS — Z23 Encounter for immunization: Secondary | ICD-10-CM | POA: Insufficient documentation

## 2010-10-24 DIAGNOSIS — E7800 Pure hypercholesterolemia, unspecified: Secondary | ICD-10-CM | POA: Insufficient documentation

## 2010-10-24 DIAGNOSIS — Y92009 Unspecified place in unspecified non-institutional (private) residence as the place of occurrence of the external cause: Secondary | ICD-10-CM | POA: Insufficient documentation

## 2010-10-24 DIAGNOSIS — W3189XA Contact with other specified machinery, initial encounter: Secondary | ICD-10-CM | POA: Insufficient documentation

## 2010-10-24 DIAGNOSIS — S92919B Unspecified fracture of unspecified toe(s), initial encounter for open fracture: Secondary | ICD-10-CM | POA: Insufficient documentation

## 2010-10-27 NOTE — Op Note (Signed)
  NAMETAESHAWN, HELFMAN NO.:  [id number]  MEDICAL RECORD NO.:  [id number]  LOCATION:  [facility]LED                         FACILITY:  [facility]  PHYSICIAN:  Jene Every, M.D.    DATE OF BIRTH:  01-Aug-1955  DATE OF PROCEDURE: DATE OF DISCHARGE:                              OPERATIVE REPORT   PREOPERATIVE DIAGNOSIS:  Open fracture and partial amputation of left great toe.  POSTOPERATIVE DIAGNOSIS:  Open fracture and partial amputation of left great toe.  PROCEDURES PERFORMED: 1. Irrigation and debridement of open fracture of the left great toe. 2. Revision of amputation with partial removal of the distal phalanx  ANESTHESIA:  Local.  ASSISTANT.:  None.  BRIEF HISTORY:  This is a 55 year old male seen in the emergency room this evening, he had caught his toe on a lawnmower.  He cleaned it at home, presented to the emergency room, it was cleaned.  X-rays demonstrated distal phalanx fracture.  He had fairly significant loss of the entire nailbed.  There was exposed distal phalanx and a soft tissue avulsion over the medial dorsal tip which was significant.  It was grossly dirty but the edges were somewhat jagged and dried blood.  He was able to dorsiflex and plantar flex the foot.  In the emergency room, he was given a Sensorcaine block prior to my arrival.  The patient is otherwise healthy.  We discussed the risks and benefits of shortening of the distal phalanx, irrigation, debridement and soft tissue coverage.  TECHNIQUE:  Dellia Beckwith the patient in supine position, after induction of the regional block, the toe was prepped and draped in usual sterile fashion. We debrided the edges sharply with scissors.  There was exposed bone. We removed some debris as well.  I used 3 L of saline delivered by pulsatile lavage to the affected area.  There was an adequate soft tissue to mobilize over the exposed bone from distal to proximal.  I therefore used a bone rongeur to shorten  the distal phalanx.  There was good bleeding tissue.  Following this, we were able to fold the distal flap proximal and secured it with two 4-0 nylon simple sutures for full coverage of the bone.  Approximately half of the distal phalanx was removed.  Following this, the remainder of the wound was dressed sterilely with Xeroform, 4x4s and a Kerlix.  The patient tolerated the procedure well and there were no complications.  He was placed on Levaquin at home, given Norco with postop shoe crutches. He will follow up in the office tomorrow.  We discussed possibility of further revision, amputation, etc.  Minimal blood loss.     Jene Every, M.D.     Cordelia Pen  D:  10/25/2010  T:  10/25/2010  Job:  [id number]  Electronically Signed by Jene Every M.D. on 10/27/2010 09:05:54 AM

## 2010-11-22 ENCOUNTER — Other Ambulatory Visit: Payer: Self-pay | Admitting: Internal Medicine

## 2010-11-22 NOTE — Telephone Encounter (Signed)
Pt called req refill of amphetamine-dextroamphetamine (ADDERALL XR) 20 MG 24 hr capsule

## 2010-11-23 ENCOUNTER — Other Ambulatory Visit: Payer: Self-pay | Admitting: *Deleted

## 2010-11-23 MED ORDER — AMPHETAMINE-DEXTROAMPHET ER 20 MG PO CP24: 20.0000 mg | ORAL_CAPSULE | ORAL | Status: DC

## 2010-11-23 NOTE — Telephone Encounter (Signed)
Pt informed ready for pick up 

## 2010-12-11 ENCOUNTER — Other Ambulatory Visit (INDEPENDENT_AMBULATORY_CARE_PROVIDER_SITE_OTHER): Payer: BC Managed Care – PPO

## 2010-12-11 DIAGNOSIS — Z0000 Encounter for general adult medical examination without abnormal findings: Secondary | ICD-10-CM

## 2010-12-11 LAB — CBC WITH DIFFERENTIAL/PLATELET
Basophils Absolute: 0 10*3/uL (ref 0.0–0.1)
Basophils Relative: 0.5 % (ref 0.0–3.0)
Eosinophils Absolute: 0.1 10*3/uL (ref 0.0–0.7)
Eosinophils Relative: 1.6 % (ref 0.0–5.0)
HCT: 43.1 % (ref 39.0–52.0)
Hemoglobin: 14.4 g/dL (ref 13.0–17.0)
Lymphocytes Relative: 33.5 % (ref 12.0–46.0)
Lymphs Abs: 2.4 10*3/uL (ref 0.7–4.0)
MCHC: 33.5 g/dL (ref 30.0–36.0)
MCV: 87.4 fl (ref 78.0–100.0)
Monocytes Absolute: 0.5 10*3/uL (ref 0.1–1.0)
Monocytes Relative: 6.4 % (ref 3.0–12.0)
Neutro Abs: 4.1 10*3/uL (ref 1.4–7.7)
Neutrophils Relative %: 58 % (ref 43.0–77.0)
Platelets: 165 10*3/uL (ref 150.0–400.0)
RBC: 4.93 Mil/uL (ref 4.22–5.81)
RDW: 13.4 % (ref 11.5–14.6)
WBC: 7.1 10*3/uL (ref 4.5–10.5)

## 2010-12-11 LAB — POCT URINALYSIS DIPSTICK
Bilirubin, UA: NEGATIVE
Glucose, UA: NEGATIVE
Ketones, UA: NEGATIVE
Leukocytes, UA: NEGATIVE
Nitrite, UA: NEGATIVE
Protein, UA: NEGATIVE
Spec Grav, UA: 1.02
Urobilinogen, UA: 0.2
pH, UA: 7

## 2010-12-11 LAB — HEPATIC FUNCTION PANEL
ALT: 24 U/L (ref 0–53)
AST: 17 U/L (ref 0–37)
Albumin: 4 g/dL (ref 3.5–5.2)
Alkaline Phosphatase: 76 U/L (ref 39–117)
Bilirubin, Direct: 0.1 mg/dL (ref 0.0–0.3)
Total Bilirubin: 0.8 mg/dL (ref 0.3–1.2)
Total Protein: 6.7 g/dL (ref 6.0–8.3)

## 2010-12-11 LAB — BASIC METABOLIC PANEL
BUN: 21 mg/dL (ref 6–23)
CO2: 30 mEq/L (ref 19–32)
Calcium: 9.7 mg/dL (ref 8.4–10.5)
Chloride: 103 mEq/L (ref 96–112)
Creatinine, Ser: 0.7 mg/dL (ref 0.4–1.5)
GFR: 115.8 mL/min (ref >60.00)
Glucose, Bld: 99 mg/dL (ref 70–99)
Potassium: 4.3 mEq/L (ref 3.5–5.1)
Sodium: 140 mEq/L (ref 135–145)

## 2010-12-11 LAB — LIPID PANEL
Cholesterol: 206 mg/dL — ABNORMAL HIGH (ref 0–200)
HDL: 36.4 mg/dL — ABNORMAL LOW (ref >39.00)
Total CHOL/HDL Ratio: 6
Triglycerides: 255 mg/dL — ABNORMAL HIGH (ref 0.0–149.0)
VLDL: 51 mg/dL — ABNORMAL HIGH (ref 0.0–40.0)

## 2010-12-11 LAB — LDL CHOLESTEROL, DIRECT: Direct LDL: 119.9 mg/dL

## 2010-12-11 LAB — TSH: TSH: 1.35 u[IU]/mL (ref 0.35–5.50)

## 2010-12-11 LAB — PSA: PSA: 2.33 ng/mL (ref 0.10–4.00)

## 2010-12-18 ENCOUNTER — Encounter: Payer: BC Managed Care – PPO | Admitting: Internal Medicine

## 2011-01-18 ENCOUNTER — Ambulatory Visit (INDEPENDENT_AMBULATORY_CARE_PROVIDER_SITE_OTHER): Payer: BC Managed Care – PPO | Admitting: Internal Medicine

## 2011-01-18 ENCOUNTER — Encounter: Payer: Self-pay | Admitting: Internal Medicine

## 2011-01-18 VITALS — BP 130/80 | HR 76 | Temp 98.2°F | Resp 16 | Ht 71.0 in | Wt 270.0 lb

## 2011-01-18 DIAGNOSIS — Z0000 Encounter for general adult medical examination without abnormal findings: Secondary | ICD-10-CM

## 2011-01-18 DIAGNOSIS — Z23 Encounter for immunization: Secondary | ICD-10-CM

## 2011-01-18 DIAGNOSIS — G4733 Obstructive sleep apnea (adult) (pediatric): Secondary | ICD-10-CM

## 2011-01-18 DIAGNOSIS — E8881 Metabolic syndrome: Secondary | ICD-10-CM

## 2011-01-24 ENCOUNTER — Encounter: Payer: Self-pay | Admitting: Internal Medicine

## 2011-01-24 NOTE — Progress Notes (Signed)
  Subjective:    Patient ID: Jermaine Johnson, male    DOB: December 08, 1955, 55 y.o.   MRN: [id number]  HPI  Patient is an obese 55 year old white male presents for complete physical examination.  Comorbid findings includes metabolic syndrome with elevated blood glucoses morbid obesity adult attention deficit disorder and sleep apnea.  He states that he has been compliant with his apnea machine he is off all ADD medications because he lost his job and does not feel he needs to take him off his job. He has been trying to avoid sweets and quit snacking but he has been doing some stretching which has resulted in worse diabetic control  Review of Systems  Constitutional: Negative for fever and fatigue.  HENT: Negative for hearing loss, congestion, neck pain and postnasal drip.   Eyes: Negative for discharge, redness and visual disturbance.  Respiratory: Negative for cough, shortness of breath and wheezing.   Cardiovascular: Negative for leg swelling.  Gastrointestinal: Negative for abdominal pain, constipation and abdominal distention.  Genitourinary: Negative for urgency and frequency.  Musculoskeletal: Negative for joint swelling and arthralgias.  Skin: Negative for color change and rash.  Neurological: Negative for weakness and light-headedness.  Hematological: Negative for adenopathy.  Psychiatric/Behavioral: Negative for behavioral problems.   Past Medical History  Diagnosis Date  . Allergy   . Hyperlipidemia   . History of colonoscopy with polypectomy    Past Surgical History  Procedure Date  . Appendectomy   . Vasectomy   . Hernia repair     reports that he has been smoking.  He has never used smokeless tobacco. He reports that he drinks about 2.4 ounces of alcohol per week. He reports that he does not use illicit drugs. family history includes Alzheimer's disease in his father; Diabetes in his father; Heart disease in his mother; and Hyperlipidemia in his mother. No Known  Allergies     Objective:   Physical Exam  Constitutional: He is oriented to person, place, and time. He appears well-developed and well-nourished.  HENT:  Head: Normocephalic and atraumatic.  Eyes: Conjunctivae are normal. Pupils are equal, round, and reactive to light.  Neck: Normal range of motion. Neck supple.  Cardiovascular: Normal rate and regular rhythm.   Pulmonary/Chest: Effort normal and breath sounds normal.  Abdominal: Soft. Bowel sounds are normal.  Genitourinary: Rectum normal and prostate normal.  Neurological: He is oriented to person, place, and time.  Skin: Skin is warm and dry.  Psychiatric: He has a normal mood and affect. His behavior is normal.          Assessment & Plan:   Patient presents for yearly preventative medicine examination.   all immunizations and health maintenance protocols were reviewed with the patient and they are up to date with these protocols.   screening laboratory values were reviewed with the patient including screening of hyperlipidemia PSA renal function and hepatic function.   There medications past medical history social history problem list and allergies were reviewed in detail.   Goals were established with regard to weight loss exercise diet in compliance with medications  We established a goal for weight loss and for compliance with his medications.  He has not been compliant with his lipid-lowering protocol and we set weight loss in compliance with the omega-3 use as her primary goal with a recheck of his cholesterol in 3 months.

## 2011-01-24 NOTE — Patient Instructions (Signed)
Patient was instructed to continue all medications as prescribed. To stop at the checkout desk and schedule a followup appointment  

## 2011-01-28 ENCOUNTER — Encounter: Payer: BC Managed Care – PPO | Admitting: Internal Medicine

## 2011-07-16 ENCOUNTER — Telehealth: Payer: Self-pay | Admitting: *Deleted

## 2011-07-16 MED ORDER — AZITHROMYCIN 250 MG PO TABS: ORAL_TABLET | ORAL | Status: AC

## 2011-07-16 NOTE — Telephone Encounter (Signed)
Notified pt. And wife. 

## 2011-07-16 NOTE — Telephone Encounter (Signed)
Per dr jenkins- may have z pack and mucinex fast max 

## 2011-07-16 NOTE — Telephone Encounter (Signed)
Pt's wife is asking for a Zpack and cough meds for Iren.   He has begun running a fever, and she is finally feeling a little better on the antibiotics.

## 2011-07-19 ENCOUNTER — Ambulatory Visit (INDEPENDENT_AMBULATORY_CARE_PROVIDER_SITE_OTHER): Payer: BC Managed Care – PPO | Admitting: Internal Medicine

## 2011-07-19 ENCOUNTER — Encounter: Payer: Self-pay | Admitting: Internal Medicine

## 2011-07-19 DIAGNOSIS — E785 Hyperlipidemia, unspecified: Secondary | ICD-10-CM

## 2011-07-19 DIAGNOSIS — J069 Acute upper respiratory infection, unspecified: Secondary | ICD-10-CM

## 2011-07-19 DIAGNOSIS — E8881 Metabolic syndrome: Secondary | ICD-10-CM

## 2011-07-19 DIAGNOSIS — J189 Pneumonia, unspecified organism: Secondary | ICD-10-CM

## 2011-07-19 DIAGNOSIS — J157 Pneumonia due to Mycoplasma pneumoniae: Secondary | ICD-10-CM

## 2011-07-19 MED ORDER — HYDROCODONE-HOMATROPINE 5-1.5 MG/5ML PO SYRP: 5.0000 mL | ORAL_SOLUTION | Freq: Three times a day (TID) | ORAL | Status: AC | PRN

## 2011-07-19 MED ORDER — LEVOFLOXACIN 500 MG PO TABS: 500.0000 mg | ORAL_TABLET | Freq: Every day | ORAL | Status: AC

## 2011-07-19 MED ORDER — INOSITOL NIACINATE 500 MG PO CAPS: 1.0000 | ORAL_CAPSULE | ORAL | Status: DC

## 2011-07-19 NOTE — Patient Instructions (Signed)
The patient is instructed to continue all medications as prescribed. Schedule followup with check out clerk upon leaving the clinic  

## 2011-07-19 NOTE — Progress Notes (Signed)
Subjective:    Patient ID: Jermaine Johnson, male    DOB: [birth year], 56 y.o.   MRN: [id number]  HPI Patient is an approximately one-week history of upper respiratory tract infection.  His impression was preceded by his wife's infection he has had symptoms of cough and paroxysms of cough that up and severe enough that he did cough up blood.  He's had low-grade fever. He has had associated muscle pain and sore throat. He describes the muscle pain primarily has pain in the joints.  Did have loose stools which has resulted.   Review of Systems  Constitutional: Positive for fever.  HENT: Positive for congestion and rhinorrhea.   Respiratory: Positive for shortness of breath and wheezing.        Past Medical History  Diagnosis Date  . Allergy   . Hyperlipidemia   . History of colonoscopy with polypectomy     History   Social History  . Marital Status: Married    Spouse Name: N/A    Number of Children: N/A  . Years of Education: N/A   Occupational History  . Not on file.   Social History Main Topics  . Smoking status: Current Some Day Smoker  . Smokeless tobacco: Never Used   Comment: SMOKES CIGARS AT TIMES  . Alcohol Use: 2.4 oz/week    4 Glasses of wine per week  . Drug Use: No  . Sexually Active: Yes   Other Topics Concern  . Not on file   Social History Narrative  . No narrative on file    Past Surgical History  Procedure Date  . Appendectomy   . Vasectomy   . Hernia repair     Family History  Problem Relation Age of Onset  . Heart disease Mother   . Hyperlipidemia Mother   . Diabetes Father   . Alzheimer's disease Father     No Known Allergies  Current Outpatient Prescriptions on File Prior to Visit  Medication Sig Dispense Refill  . azithromycin (ZITHROMAX Z-PAK) 250 MG tablet Take 2 tablets (500 mg) on  Day 1,  followed by 1 tablet (250 mg) once daily on Days 2 through 5.  6 each  0  . ibuprofen (ADVIL,MOTRIN) 200 MG tablet Take 200 mg by mouth  every 6 (six) hours as needed.        Marland Kitchen amphetamine-dextroamphetamine (ADDERALL XR) 20 MG 24 hr capsule Take 1 capsule (20 mg total) by mouth every morning.  90 capsule  0  . rosuvastatin (CRESTOR) 10 MG tablet Take 10 mg by mouth daily.          BP 130/80  Pulse 72  Temp 98 F (36.7 C)  Resp 16  Ht 5\' 11"  (1.803 m)  Wt 264 lb (119.75 kg)  BMI 36.82 kg/m2    Objective:   Physical Exam  Nursing note and vitals reviewed. Constitutional: He is oriented to person, place, and time. He appears well-developed and well-nourished.  HENT:  Head: Normocephalic and atraumatic.  Eyes: Conjunctivae are normal. Pupils are equal, round, and reactive to light.  Neck: Normal range of motion. Neck supple.  Cardiovascular: Normal rate and regular rhythm.   Pulmonary/Chest:        wheezing throughout all fields with egophony  Abdominal: Soft. Bowel sounds are normal.  Musculoskeletal: Normal range of motion.  Neurological: He is alert and oriented to person, place, and time.          Assessment & Plan:  Patient most probably  has a walking pneumonia. I suspect that this is a bacterial infection with Haemophilus influenza and will treat it as such he will be placed on Levaquin 500 mg for 10 days He'll give them a prescription for a codeine-containing cough Discussed cholesterol management and will resume niacin flush free with a low-fat snack daily He is noncompliant with his sleep apnea mask

## 2011-07-30 ENCOUNTER — Other Ambulatory Visit: Payer: Self-pay | Admitting: *Deleted

## 2011-07-30 MED ORDER — HYDROCODONE-HOMATROPINE 5-1.5 MG/5ML PO SYRP: 5.0000 mL | ORAL_SOLUTION | Freq: Three times a day (TID) | ORAL | Status: AC | PRN

## 2012-01-14 ENCOUNTER — Other Ambulatory Visit (INDEPENDENT_AMBULATORY_CARE_PROVIDER_SITE_OTHER): Payer: BC Managed Care – PPO

## 2012-01-14 DIAGNOSIS — Z0000 Encounter for general adult medical examination without abnormal findings: Secondary | ICD-10-CM

## 2012-01-14 LAB — CBC WITH DIFFERENTIAL/PLATELET
Basophils Absolute: 0 10*3/uL (ref 0.0–0.1)
Eosinophils Absolute: 0.1 10*3/uL (ref 0.0–0.7)
MCHC: 33.3 g/dL (ref 30.0–36.0)
MCV: 87.6 fl (ref 78.0–100.0)
Monocytes Absolute: 0.4 10*3/uL (ref 0.1–1.0)
Neutrophils Relative %: 54.7 % (ref 43.0–77.0)
Platelets: 183 10*3/uL (ref 150.0–400.0)
RDW: 13.3 % (ref 11.5–14.6)

## 2012-01-14 LAB — POCT URINALYSIS DIPSTICK
Bilirubin, UA: NEGATIVE
Glucose, UA: NEGATIVE
Leukocytes, UA: NEGATIVE
Nitrite, UA: NEGATIVE
pH, UA: 5.5

## 2012-01-14 LAB — BASIC METABOLIC PANEL
BUN: 20 mg/dL (ref 6–23)
CO2: 28 mEq/L (ref 19–32)
Calcium: 8.8 mg/dL (ref 8.4–10.5)
Chloride: 99 mEq/L (ref 96–112)
Creatinine, Ser: 1.1 mg/dL (ref 0.4–1.5)

## 2012-01-14 LAB — HEPATIC FUNCTION PANEL
Bilirubin, Direct: 0.1 mg/dL (ref 0.0–0.3)
Total Bilirubin: 0.9 mg/dL (ref 0.3–1.2)
Total Protein: 6.5 g/dL (ref 6.0–8.3)

## 2012-01-14 LAB — PSA: PSA: 2.54 ng/mL (ref 0.10–4.00)

## 2012-01-14 LAB — LIPID PANEL: HDL: 33.9 mg/dL — ABNORMAL LOW (ref >39.00)

## 2012-01-23 ENCOUNTER — Encounter: Payer: Self-pay | Admitting: Internal Medicine

## 2012-01-23 ENCOUNTER — Ambulatory Visit (INDEPENDENT_AMBULATORY_CARE_PROVIDER_SITE_OTHER): Payer: BC Managed Care – PPO | Admitting: Internal Medicine

## 2012-01-23 VITALS — BP 116/78 | HR 72 | Temp 98.3°F | Resp 16 | Ht 71.5 in | Wt 268.0 lb

## 2012-01-23 DIAGNOSIS — Z0000 Encounter for general adult medical examination without abnormal findings: Secondary | ICD-10-CM

## 2012-01-23 DIAGNOSIS — M7020 Olecranon bursitis, unspecified elbow: Secondary | ICD-10-CM

## 2012-01-23 DIAGNOSIS — M7022 Olecranon bursitis, left elbow: Secondary | ICD-10-CM

## 2012-01-23 DIAGNOSIS — Z23 Encounter for immunization: Secondary | ICD-10-CM

## 2012-01-23 MED ORDER — METHYLPREDNISOLONE ACETATE 40 MG/ML IJ SUSP: 40.0000 mg | Freq: Once | INTRAMUSCULAR | Status: DC

## 2012-01-23 NOTE — Patient Instructions (Signed)
You have received a steroid injection into a joint space. It will take up to 48 hours before you notice a difference in the pain in the joint. For the next few hours keep ice on the site of the injection. Do not exert the injected joint for the next 24 hours.  

## 2012-01-23 NOTE — Progress Notes (Signed)
Subjective:    Patient ID: Jermaine Johnson, male    DOB: 1955-07-26, 56 y.o.   MRN: [id number]  HPI  Patient is a 56 year old male presents for yearly physical examination.  He is followed for hyperlipidemia ADD obesity and has the following acute complaint His triglycerides are elevated. Hx of chipped bone in elbow with increased pain and pressure and decreased mobility in the left elbow.  Review of Systems  Constitutional: Positive for fatigue. Negative for fever.  HENT: Positive for congestion and rhinorrhea. Negative for hearing loss, neck pain and postnasal drip.   Eyes: Negative for discharge, redness and visual disturbance.  Respiratory: Negative for cough, shortness of breath and wheezing.   Cardiovascular: Negative for leg swelling.  Gastrointestinal: Negative for abdominal pain, constipation and abdominal distention.  Genitourinary: Negative for urgency and frequency.  Musculoskeletal: Negative for joint swelling and arthralgias.  Skin: Negative for color change and rash.  Neurological: Negative for weakness and light-headedness.  Hematological: Negative for adenopathy.  Psychiatric/Behavioral: Positive for dysphoric mood. Negative for behavioral problems.   Past Medical History  Diagnosis Date  . Allergy   . Hyperlipidemia   . History of colonoscopy with polypectomy     History   Social History  . Marital Status: Married    Spouse Name: N/A    Number of Children: N/A  . Years of Education: N/A   Occupational History  . Not on file.   Social History Main Topics  . Smoking status: Current Some Day Smoker  . Smokeless tobacco: Never Used   Comment: SMOKES CIGARS AT TIMES  . Alcohol Use: 2.4 oz/week    4 Glasses of wine per week  . Drug Use: No  . Sexually Active: Yes   Other Topics Concern  . Not on file   Social History Narrative  . No narrative on file    Past Surgical History  Procedure Date  . Appendectomy   . Vasectomy   . Hernia repair      Family History  Problem Relation Age of Onset  . Heart disease Mother   . Hyperlipidemia Mother   . Diabetes Father   . Alzheimer's disease Father     No Known Allergies  Current Outpatient Prescriptions on File Prior to Visit  Medication Sig Dispense Refill  . ibuprofen (ADVIL,MOTRIN) 200 MG tablet Take 200 mg by mouth every 6 (six) hours as needed.        . Inositol Niacinate (NIACIN FLUSH FREE) 500 MG CAPS Take 1 capsule (500 mg total) by mouth with snacks. Low fat snaks  180 each  0  . DISCONTD: amphetamine-dextroamphetamine (ADDERALL XR) 20 MG 24 hr capsule Take 1 capsule (20 mg total) by mouth every morning.  90 capsule  0  . DISCONTD: rosuvastatin (CRESTOR) 10 MG tablet Take 10 mg by mouth daily.          BP 116/78  Pulse 72  Temp 98.3 F (36.8 C)  Resp 16  Ht 5' 11.5" (1.816 m)  Wt 268 lb (121.564 kg)  BMI 36.86 kg/m2        Objective:   Physical Exam  Nursing note and vitals reviewed. Constitutional: He is oriented to person, place, and time. He appears well-developed and well-nourished.  HENT:  Head: Normocephalic and atraumatic.  Eyes: Conjunctivae normal are normal. Pupils are equal, round, and reactive to light.  Neck: Normal range of motion. Neck supple.  Cardiovascular: Normal rate and regular rhythm.   Pulmonary/Chest: Effort normal and breath  sounds normal.  Abdominal: Soft. Bowel sounds are normal.  Genitourinary: Rectum normal.       Enlarged prostate  Musculoskeletal: He exhibits edema and tenderness.  Neurological: He is alert and oriented to person, place, and time.  Skin: Skin is warm and dry.  Psychiatric: He has a normal mood and affect.          Assessment & Plan:   Patient presents for yearly preventative medicine examination.   all immunizations and health maintenance protocols were reviewed with the patient and they are up to date with these protocols.   screening laboratory values were reviewed with the patient including  screening of hyperlipidemia PSA renal function and hepatic function.   There medications past medical history social history problem list and allergies were reviewed in detail. Patient Jermaine Johnson is improved by 15 points of which all 15 were an LDL cholesterol he is demented be more compliant with his niacin.  We discussed sugar and carbohydrate moderation.    Goals were established with regard to weight loss exercise diet in compliance with medications  Pain in left elbow most probably a lateral on bursitis but no history of trauma with a bone chip will get plain films and go ahead and do a bursal injection in the elbow to see what therapeutic response is.  If no response to the injection and if altered anatomy detected on x-ray refer to orthopedist.  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 Local anesthesia was obtained with topical spray. Then 40 mg of Depo-Medrol and 1/2 cc of lidocaine was injected into the joint space. The patient tolerated the procedure without complications. Post injection care discussed with patient.

## 2012-07-22 ENCOUNTER — Ambulatory Visit: Payer: BC Managed Care – PPO | Admitting: Internal Medicine

## 2012-10-20 ENCOUNTER — Telehealth: Payer: Self-pay | Admitting: Internal Medicine

## 2012-10-20 MED ORDER — SCOPOLAMINE 1 MG/3DAYS TD PT72: 1.0000 | MEDICATED_PATCH | TRANSDERMAL | Status: DC

## 2012-10-20 NOTE — Telephone Encounter (Signed)
PT is requesting a RX for sea sickness. He states that Dr. Lovell Sheehan has prescribed this is the past, and it is a patch that is placed behind the ear. He is leaving for a cruise on 11/07/12. Please assist.

## 2012-10-20 NOTE — Telephone Encounter (Signed)
Please let pt know this was done.

## 2012-10-20 NOTE — Telephone Encounter (Signed)
Called and s/w pt. He's aware.

## 2012-10-20 NOTE — Telephone Encounter (Signed)
Please let pt know it was sent to [facility]

## 2012-11-11 ENCOUNTER — Ambulatory Visit: Payer: Self-pay | Admitting: Internal Medicine

## 2012-11-16 ENCOUNTER — Telehealth: Payer: Self-pay | Admitting: Internal Medicine

## 2012-11-16 NOTE — Telephone Encounter (Signed)
Patient Information:  Caller Name: Annabelle Harman  Phone: [phone]  Patient: Jermaine Johnson, Jermaine Johnson  Gender: Male  DOB: [birth year]  Age: 57 Years  PCP: Darryll Capers (Adults only)  Office Follow Up:  Does the office need to follow up with this patient?: No  Instructions For The Office: N/A  RN Note:  Ran a fever early in the course of the illness, but fever has resolved.  Cough is hoarse, barky, and productive of green mucus.  Cough disturbs/keeps from sleeping.  Per cough protocol, emergent symptoms denied; advised appt within 24 hours due to cough disturbing sleep.  No appts available with Dr. Lovell Sheehan or Ms. Orvan Falconer 11/16/12; appt scheduled 11/17/12 1330 with Ms. Orvan Falconer.  krs/can  Symptoms  Reason For Call & Symptoms: returned from vacation with a cough.  Reviewed Health History In EMR: Yes  Reviewed Medications In EMR: Yes  Reviewed Allergies In EMR: Yes  Reviewed Surgeries / Procedures: Yes  Date of Onset of Symptoms: 11/07/2012  Guideline(s) Used:  Cough  Disposition Per Guideline:   See Today or Tomorrow in Office  Reason For Disposition Reached:   Continuous (nonstop) coughing interferes with work or school and no improvement using cough treatment per Care Advice  Advice Given:  N/A  Patient Will Follow Care Advice:  YES  Appointment Scheduled:  11/17/2012 13:30:00 Appointment Scheduled Provider:  Adline Mango [facility])

## 2012-11-17 ENCOUNTER — Ambulatory Visit (INDEPENDENT_AMBULATORY_CARE_PROVIDER_SITE_OTHER): Payer: BC Managed Care – PPO | Admitting: Family

## 2012-11-17 ENCOUNTER — Encounter: Payer: Self-pay | Admitting: Family

## 2012-11-17 VITALS — BP 134/88 | HR 77 | Temp 98.6°F | Wt 280.0 lb

## 2012-11-17 DIAGNOSIS — J069 Acute upper respiratory infection, unspecified: Secondary | ICD-10-CM

## 2012-11-17 DIAGNOSIS — I491 Atrial premature depolarization: Secondary | ICD-10-CM

## 2012-11-17 MED ORDER — METHYLPREDNISOLONE 4 MG PO KIT: PACK | ORAL | Status: AC

## 2012-11-17 NOTE — Progress Notes (Signed)
Subjective:    Patient ID: Jermaine Johnson, male    DOB: Sep 15, 1955, 57 y.o.   MRN: [id number]  HPI 57 year old white male, nonsmoker, patient of Dr. Lovell Sheehan in today with complaints of cough, congestion, postnasal drip ongoing x10 days. If symptoms originally started on a cruise and felt like they were better but is progressively worse. He has a history of bronchitis and sleep apnea.   Review of Systems  Constitutional: Negative.   HENT: Positive for congestion, rhinorrhea and postnasal drip.   Respiratory: Positive for cough.   Cardiovascular: Positive for palpitations. Negative for chest pain and leg swelling.       Reports have been an occasional irregular beat and he only can't hear or feel at night.  Gastrointestinal: Negative.   Musculoskeletal: Negative.   Skin: Negative.   Allergic/Immunologic: Negative.   Neurological: Negative.   Psychiatric/Behavioral: Negative.    Past Medical History  Diagnosis Date  . Allergy   . Hyperlipidemia   . History of colonoscopy with polypectomy     History   Social History  . Marital Status: Married    Spouse Name: N/A    Number of Children: N/A  . Years of Education: N/A   Occupational History  . Not on file.   Social History Main Topics  . Smoking status: Current Some Day Smoker  . Smokeless tobacco: Never Used     Comment: SMOKES CIGARS AT TIMES  . Alcohol Use: 2.4 oz/week    4 Glasses of wine per week  . Drug Use: No  . Sexually Active: Yes   Other Topics Concern  . Not on file   Social History Narrative  . No narrative on file    Past Surgical History  Procedure Laterality Date  . Appendectomy    . Vasectomy    . Hernia repair      Family History  Problem Relation Age of Onset  . Heart disease Mother   . Hyperlipidemia Mother   . Diabetes Father   . Alzheimer's disease Father     No Known Allergies  Current Outpatient Prescriptions on File Prior to Visit  Medication Sig Dispense Refill  . ibuprofen  (ADVIL,MOTRIN) 200 MG tablet Take 200 mg by mouth every 6 (six) hours as needed.        . Inositol Niacinate (NIACIN FLUSH FREE) 500 MG CAPS Take 1 capsule (500 mg total) by mouth with snacks. Low fat snaks  180 each  0  . scopolamine (TRANSDERM-SCOP) 1.5 MG Place 1 patch (1.5 mg total) onto the skin every 3 (three) days.  10 patch  1  . [DISCONTINUED] amphetamine-dextroamphetamine (ADDERALL XR) 20 MG 24 hr capsule Take 1 capsule (20 mg total) by mouth every morning.  90 capsule  0  . [DISCONTINUED] rosuvastatin (CRESTOR) 10 MG tablet Take 10 mg by mouth daily.         No current facility-administered medications on file prior to visit.    BP 134/88  Pulse 77  Temp(Src) 98.6 F (37 C) (Oral)  Wt 280 lb (127.007 kg)  BMI 38.51 kg/m2  SpO2 98%chart    Objective:   Physical Exam  Constitutional: He is oriented to person, place, and time. He appears well-developed and well-nourished.  HENT:  Right Ear: External ear normal.  Left Ear: External ear normal.  Nose: Nose normal.  Mouth/Throat: Oropharynx is clear and moist.  Neck: Normal range of motion. Neck supple. No thyromegaly present.  Cardiovascular: Normal rate and normal heart sounds.  PACs  Pulmonary/Chest: Effort normal and breath sounds normal.  Musculoskeletal: Normal range of motion.  Neurological: He is alert and oriented to person, place, and time.  Skin: Skin is warm and dry.  Psychiatric: He has a normal mood and affect.      EKG:NSR, WNL    Assessment & Plan:  Assessment: 1. Acute Bronchitis 2. Cough 3. Premature Atrial Contractions-incidental finding  Plan: Medrol as directed. Call the office if symptoms worsen or persist. Recheck as scheduled, and if needed.

## 2013-01-18 ENCOUNTER — Other Ambulatory Visit (INDEPENDENT_AMBULATORY_CARE_PROVIDER_SITE_OTHER): Payer: BC Managed Care – PPO

## 2013-01-18 DIAGNOSIS — Z0000 Encounter for general adult medical examination without abnormal findings: Secondary | ICD-10-CM

## 2013-01-18 LAB — CBC WITH DIFFERENTIAL/PLATELET
Basophils Relative: 0.6 % (ref 0.0–3.0)
Eosinophils Absolute: 0.1 10*3/uL (ref 0.0–0.7)
MCHC: 34.4 g/dL (ref 30.0–36.0)
MCV: 85.3 fl (ref 78.0–100.0)
Monocytes Absolute: 0.4 10*3/uL (ref 0.1–1.0)
Neutrophils Relative %: 56.8 % (ref 43.0–77.0)
Platelets: 179 10*3/uL (ref 150.0–400.0)

## 2013-01-18 LAB — POCT URINALYSIS DIPSTICK
Bilirubin, UA: NEGATIVE
Leukocytes, UA: NEGATIVE
Nitrite, UA: NEGATIVE
Protein, UA: NEGATIVE
Urobilinogen, UA: 0.2
pH, UA: 5.5

## 2013-01-18 LAB — BASIC METABOLIC PANEL
BUN: 19 mg/dL (ref 6–23)
CO2: 29 mEq/L (ref 19–32)
Chloride: 104 mEq/L (ref 96–112)
Creatinine, Ser: 0.9 mg/dL (ref 0.4–1.5)
Glucose, Bld: 91 mg/dL (ref 70–99)

## 2013-01-18 LAB — LIPID PANEL: HDL: 31.7 mg/dL — ABNORMAL LOW (ref >39.00)

## 2013-01-18 LAB — PSA: PSA: 2.44 ng/mL (ref 0.10–4.00)

## 2013-01-18 LAB — HEPATIC FUNCTION PANEL
Bilirubin, Direct: 0.1 mg/dL (ref 0.0–0.3)
Total Bilirubin: 0.5 mg/dL (ref 0.3–1.2)
Total Protein: 6.6 g/dL (ref 6.0–8.3)

## 2013-01-25 ENCOUNTER — Encounter: Payer: Self-pay | Admitting: Internal Medicine

## 2013-01-25 ENCOUNTER — Ambulatory Visit (INDEPENDENT_AMBULATORY_CARE_PROVIDER_SITE_OTHER): Payer: BC Managed Care – PPO | Admitting: Internal Medicine

## 2013-01-25 VITALS — BP 140/84 | HR 80 | Temp 98.0°F | Resp 16 | Ht 71.5 in | Wt 280.0 lb

## 2013-01-25 DIAGNOSIS — E785 Hyperlipidemia, unspecified: Secondary | ICD-10-CM

## 2013-01-25 DIAGNOSIS — Z23 Encounter for immunization: Secondary | ICD-10-CM

## 2013-01-25 DIAGNOSIS — Z0000 Encounter for general adult medical examination without abnormal findings: Secondary | ICD-10-CM

## 2013-01-25 MED ORDER — OMEGA-3 KRILL OIL 300 MG PO CAPS: 1.0000 | ORAL_CAPSULE | Freq: Two times a day (BID) | ORAL | Status: AC

## 2013-01-25 NOTE — Progress Notes (Signed)
Subjective:    Patient ID: Jermaine Johnson, male    DOB: 10-May-1955, 57 y.o.   MRN: [id number]  HPI CPX History of hematuria  Stable Liver renal and CBC stable Elevated TG Stopped the niacin    Review of Systems  Constitutional: Negative for fever and fatigue.  HENT: Negative for hearing loss, congestion, neck pain and postnasal drip.   Eyes: Negative for discharge, redness and visual disturbance.  Respiratory: Negative for cough, shortness of breath and wheezing.   Cardiovascular: Negative for leg swelling.  Gastrointestinal: Negative for abdominal pain, constipation and abdominal distention.  Genitourinary: Negative for urgency and frequency.  Musculoskeletal: Negative for joint swelling and arthralgias.  Skin: Negative for color change and rash.  Neurological: Negative for weakness and light-headedness.  Hematological: Negative for adenopathy.  Psychiatric/Behavioral: Negative for behavioral problems.   Past Medical History  Diagnosis Date  . Allergy   . Hyperlipidemia   . History of colonoscopy with polypectomy     History   Social History  . Marital Status: Married    Spouse Name: N/A    Number of Children: N/A  . Years of Education: N/A   Occupational History  . Not on file.   Social History Main Topics  . Smoking status: Current Some Day Smoker  . Smokeless tobacco: Never Used     Comment: SMOKES CIGARS AT TIMES  . Alcohol Use: 2.4 oz/week    4 Glasses of wine per week  . Drug Use: No  . Sexual Activity: Yes   Other Topics Concern  . Not on file   Social History Narrative  . No narrative on file    Past Surgical History  Procedure Laterality Date  . Appendectomy    . Vasectomy    . Hernia repair      Family History  Problem Relation Age of Onset  . Heart disease Mother   . Hyperlipidemia Mother   . Diabetes Father   . Alzheimer's disease Father     No Known Allergies  Current Outpatient Prescriptions on File Prior to Visit   Medication Sig Dispense Refill  . ibuprofen (ADVIL,MOTRIN) 200 MG tablet Take 200 mg by mouth every 6 (six) hours as needed.        Marland Kitchen scopolamine (TRANSDERM-SCOP) 1.5 MG Place 1 patch (1.5 mg total) onto the skin every 3 (three) days.  10 patch  1  . [DISCONTINUED] amphetamine-dextroamphetamine (ADDERALL XR) 20 MG 24 hr capsule Take 1 capsule (20 mg total) by mouth every morning.  90 capsule  0  . [DISCONTINUED] rosuvastatin (CRESTOR) 10 MG tablet Take 10 mg by mouth daily.         No current facility-administered medications on file prior to visit.    BP 140/84  Pulse 80  Temp(Src) 98 F (36.7 C)  Resp 16  Ht 5' 11.5" (1.816 m)  Wt 280 lb (127.007 kg)  BMI 38.51 kg/m2       Objective:   Physical Exam  Constitutional: He is oriented to person, place, and time. He appears well-developed and well-nourished.  HENT:  Head: Normocephalic and atraumatic.  Eyes: Conjunctivae are normal. Pupils are equal, round, and reactive to light.  Neck: Normal range of motion. Neck supple.  Cardiovascular: Normal rate and regular rhythm.   Pulmonary/Chest: Effort normal and breath sounds normal.  Abdominal: Soft. Bowel sounds are normal.  Genitourinary: Prostate normal.  Musculoskeletal: He exhibits edema and tenderness.  Neurological: He is alert and oriented to person, place, and time.  Skin: Skin is warm and dry.  Psychiatric: He has a normal mood and affect. His behavior is normal.          Assessment & Plan:  Weight loss   Patient presents for yearly preventative medicine examination.   all immunizations and health maintenance protocols were reviewed with the patient and they are up to date with these protocols.   screening laboratory values were reviewed with the patient including screening of hyperlipidemia PSA renal function and hepatic function.   There medications past medical history social history problem list and allergies were reviewed in detail.   Goals were  established with regard to weight loss exercise diet in compliance with medications  TG management

## 2013-01-25 NOTE — Patient Instructions (Addendum)
Stop the niacin Replace with Kril oil 300 twice day Exercise to Lose Weight Exercise and a healthy diet may help you lose weight. Your doctor may suggest specific exercises. EXERCISE IDEAS AND TIPS  Choose low-cost things you enjoy doing, such as walking, bicycling, or exercising to workout videos.  Take stairs instead of the elevator.  Walk during your lunch break.  Park your car further away from work or school.  Go to a gym or an exercise class.  Start with 5 to 10 minutes of exercise each day. Build up to 30 minutes of exercise 4 to 6 days a week.  Wear shoes with good support and comfortable clothes.  Stretch before and after working out.  Work out until you breathe harder and your heart beats faster.  Drink extra water when you exercise.  Do not do so much that you hurt yourself, feel dizzy, or get very short of breath. Exercises that burn about 150 calories:  Running 1  miles in 15 minutes.  Playing volleyball for 45 to 60 minutes.  Washing and waxing a car for 45 to 60 minutes.  Playing touch football for 45 minutes.  Walking 1  miles in 35 minutes.  Pushing a stroller 1  miles in 30 minutes.  Playing basketball for 30 minutes.  Raking leaves for 30 minutes.  Bicycling 5 miles in 30 minutes.  Walking 2 miles in 30 minutes.  Dancing for 30 minutes.  Shoveling snow for 15 minutes.  Swimming laps for 20 minutes.  Walking up stairs for 15 minutes.  Bicycling 4 miles in 15 minutes.  Gardening for 30 to 45 minutes.  Jumping rope for 15 minutes.  Washing windows or floors for 45 to 60 minutes. Document Released: 05/18/2010 Document Revised: 07/08/2011 Document Reviewed: 05/18/2010 [facility] Patient Information 2014 [place], [place].

## 2013-07-26 ENCOUNTER — Encounter: Payer: Self-pay | Admitting: Internal Medicine

## 2013-07-26 ENCOUNTER — Ambulatory Visit (INDEPENDENT_AMBULATORY_CARE_PROVIDER_SITE_OTHER): Payer: BC Managed Care – PPO | Admitting: Internal Medicine

## 2013-07-26 VITALS — BP 130/80 | HR 68 | Temp 98.3°F | Ht 71.5 in | Wt 279.0 lb

## 2013-07-26 DIAGNOSIS — E669 Obesity, unspecified: Secondary | ICD-10-CM

## 2013-07-26 DIAGNOSIS — I10 Essential (primary) hypertension: Secondary | ICD-10-CM

## 2013-07-26 NOTE — Progress Notes (Signed)
Subjective:    Patient ID: Jermaine Johnson, male    DOB: 03/29/1956, 58 y.o.   MRN: [id number]  HPI Joint pain in wrists and hands Has been working out and lost weight And is on a weight loss program   Review of Systems  Constitutional: Negative for fever and fatigue.  HENT: Negative for congestion, hearing loss and postnasal drip.   Eyes: Negative for discharge, redness and visual disturbance.  Respiratory: Negative for cough, shortness of breath and wheezing.   Cardiovascular: Negative for leg swelling.  Gastrointestinal: Negative for abdominal pain, constipation and abdominal distention.  Genitourinary: Negative for urgency and frequency.  Musculoskeletal: Positive for arthralgias. Negative for joint swelling and neck pain.       Wrists  Skin: Negative for color change and rash.  Neurological: Negative for weakness and light-headedness.  Hematological: Negative for adenopathy.  Psychiatric/Behavioral: Negative for behavioral problems.   Past Medical History  Diagnosis Date  . Allergy   . Hyperlipidemia   . History of colonoscopy with polypectomy     History   Social History  . Marital Status: Married    Spouse Name: N/A    Number of Children: N/A  . Years of Education: N/A   Occupational History  . Not on file.   Social History Main Topics  . Smoking status: Current Some Day Smoker  . Smokeless tobacco: Never Used     Comment: SMOKES CIGARS AT TIMES  . Alcohol Use: 2.4 oz/week    4 Glasses of wine per week  . Drug Use: No  . Sexual Activity: Yes   Other Topics Concern  . Not on file   Social History Narrative  . No narrative on file    Past Surgical History  Procedure Laterality Date  . Appendectomy    . Vasectomy    . Hernia repair      Family History  Problem Relation Age of Onset  . Heart disease Mother   . Hyperlipidemia Mother   . Diabetes Father   . Alzheimer's disease Father     No Known Allergies  Current Outpatient Prescriptions  on File Prior to Visit  Medication Sig Dispense Refill  . ibuprofen (ADVIL,MOTRIN) 200 MG tablet Take 200 mg by mouth every 6 (six) hours as needed.        . Omega-3 Krill Oil 300 MG CAPS Take 1 capsule (300 mg total) by mouth 2 (two) times daily.      . [DISCONTINUED] amphetamine-dextroamphetamine (ADDERALL XR) 20 MG 24 hr capsule Take 1 capsule (20 mg total) by mouth every morning.  90 capsule  0  . [DISCONTINUED] rosuvastatin (CRESTOR) 10 MG tablet Take 10 mg by mouth daily.         No current facility-administered medications on file prior to visit.    BP 148/90  Pulse 68  Temp(Src) 98.3 F (36.8 C) (Oral)  Ht 5' 11.5" (1.816 m)  Wt 279 lb (126.554 kg)  BMI 38.37 kg/m2       Objective:   Physical Exam  Constitutional: He appears well-developed and well-nourished.  HENT:  Head: Normocephalic and atraumatic.  Eyes: Conjunctivae are normal. Pupils are equal, round, and reactive to light.  Neck: Normal range of motion. Neck supple.  Cardiovascular: Normal rate and regular rhythm.   Pulmonary/Chest: Effort normal and breath sounds normal.  Abdominal: Soft. Bowel sounds are normal.    folliculitis      Assessment & Plan:  Pressure stable Weight control and diet  I have spent more than 30 minutes examining this patient face-to-face of which over half was spent in counseling Follow up CPX in fall

## 2013-07-26 NOTE — Patient Instructions (Signed)
-   Weight loss 

## 2013-07-26 NOTE — Progress Notes (Signed)
Pre visit review using our clinic review tool, if applicable. No additional management support is needed unless otherwise documented below in the visit note. 

## 2013-07-27 ENCOUNTER — Telehealth: Payer: Self-pay | Admitting: Internal Medicine

## 2013-07-27 NOTE — Telephone Encounter (Signed)
Relevant patient education assigned to patient using Emmi. ° °

## 2013-09-24 ENCOUNTER — Telehealth: Payer: Self-pay | Admitting: Internal Medicine

## 2013-09-24 NOTE — Telephone Encounter (Signed)
Pt is currently out of town and left his medicine at home, pt needs new rx for transderm scope (patches) 1.5 for his motion sickness. Please send [facility] [address] [phone]

## 2013-09-27 NOTE — Telephone Encounter (Signed)
Called and left a message to see if pt still needs medication.

## 2013-09-29 MED ORDER — SCOPOLAMINE 1 MG/3DAYS TD PT72: 1.0000 | MEDICATED_PATCH | TRANSDERMAL | Status: DC

## 2013-09-29 NOTE — Telephone Encounter (Signed)
[facility] [phone]

## 2013-09-29 NOTE — Telephone Encounter (Signed)
Rx sent to pharmacy   

## 2013-09-30 MED ORDER — SCOPOLAMINE 1 MG/3DAYS TD PT72: 1.0000 | MEDICATED_PATCH | TRANSDERMAL | Status: DC

## 2013-09-30 NOTE — Telephone Encounter (Signed)
Pt is traveling and is already in [place], [place].  [facility]

## 2013-09-30 NOTE — Addendum Note (Signed)
Addended by: Alfred Levins D on: 09/30/2013 10:23 AM   Modules accepted: Orders

## 2014-09-23 ENCOUNTER — Encounter: Payer: Self-pay | Admitting: Family Medicine

## 2014-09-27 ENCOUNTER — Encounter: Payer: Self-pay | Admitting: Family Medicine

## 2014-09-27 ENCOUNTER — Ambulatory Visit (INDEPENDENT_AMBULATORY_CARE_PROVIDER_SITE_OTHER): Payer: BLUE CROSS/BLUE SHIELD | Admitting: Family Medicine

## 2014-09-27 VITALS — BP 140/70 | HR 64 | Temp 98.0°F | Ht 71.0 in | Wt 278.0 lb

## 2014-09-27 DIAGNOSIS — E669 Obesity, unspecified: Secondary | ICD-10-CM | POA: Diagnosis not present

## 2014-09-27 DIAGNOSIS — Z0000 Encounter for general adult medical examination without abnormal findings: Secondary | ICD-10-CM

## 2014-09-27 LAB — TSH: TSH: 2.28 u[IU]/mL (ref 0.35–4.50)

## 2014-09-27 LAB — POCT URINALYSIS DIPSTICK
Bilirubin, UA: NEGATIVE
Glucose, UA: NEGATIVE
KETONES UA: NEGATIVE
Leukocytes, UA: NEGATIVE
Nitrite, UA: NEGATIVE
PH UA: 5.5
Protein, UA: NEGATIVE
SPEC GRAV UA: 1.025
Urobilinogen, UA: 0.2

## 2014-09-27 LAB — LIPID PANEL
CHOL/HDL RATIO: 5
Cholesterol: 196 mg/dL (ref 0–200)
HDL: 38.1 mg/dL — AB (ref >39.00)
LDL Cholesterol: 122 mg/dL — ABNORMAL HIGH (ref 0–99)
NonHDL: 157.9
Triglycerides: 180 mg/dL — ABNORMAL HIGH (ref 0.0–149.0)
VLDL: 36 mg/dL (ref 0.0–40.0)

## 2014-09-27 LAB — CBC
HCT: 41.8 % (ref 39.0–52.0)
Hemoglobin: 14.3 g/dL (ref 13.0–17.0)
MCHC: 34.2 g/dL (ref 30.0–36.0)
MCV: 86.1 fl (ref 78.0–100.0)
PLATELETS: 184 10*3/uL (ref 150.0–400.0)
RBC: 4.85 Mil/uL (ref 4.22–5.81)
RDW: 13.2 % (ref 11.5–15.5)
WBC: 8.2 10*3/uL (ref 4.0–10.5)

## 2014-09-27 LAB — COMPREHENSIVE METABOLIC PANEL
ALT: 18 U/L (ref 0–53)
AST: 16 U/L (ref 0–37)
Albumin: 4.1 g/dL (ref 3.5–5.2)
Alkaline Phosphatase: 78 U/L (ref 39–117)
BUN: 25 mg/dL — AB (ref 6–23)
CHLORIDE: 104 meq/L (ref 96–112)
CO2: 25 meq/L (ref 19–32)
CREATININE: 1.07 mg/dL (ref 0.40–1.50)
Calcium: 9.3 mg/dL (ref 8.4–10.5)
GFR: 74.68 mL/min (ref >60.00)
GLUCOSE: 91 mg/dL (ref 70–99)
Potassium: 4 mEq/L (ref 3.5–5.1)
Sodium: 137 mEq/L (ref 135–145)
Total Bilirubin: 0.5 mg/dL (ref 0.2–1.2)
Total Protein: 6.7 g/dL (ref 6.0–8.3)

## 2014-09-27 LAB — HEMOGLOBIN A1C: Hgb A1c MFr Bld: 5.2 % (ref 4.6–6.5)

## 2014-09-27 NOTE — Progress Notes (Signed)
Jermaine ConchStephen Hunter, MD Phone: [phone]  Subjective:  Patient presents today for their annual physical. Chief complaint-noted.   Pain in both thumb bases. Takes some advil or ibuprofen.  L knee as well.  Obesity- walks a mile a day, exercise disc, t-25 interval training intermittently Likes coffee with creamer. Beer on weekend only 1-2 a day.  Advised no cigars HLD- Krill oil, needs weight loss BP eye SBP 140, weight loss likely helps  ROS- full  review of systems was completed and negative except for as noted above.  Pertinent neg: no chest pain, shortness of breath.   The following were reviewed and entered/updated in epic: Past Medical History  Diagnosis Date  . Allergy   . Hyperlipidemia   . History of colonoscopy with polypectomy   . ONYCHOMYCOSIS 11/12/2007  . CALCANEAL SPUR, RIGHT 03/20/2010  . ROTATOR CUFF INJURY, LEFT SHOULDER 07/29/2007   Patient Active Problem List   Diagnosis Date Noted  . DYSMETABOLIC SYNDROME 02/11/2008    Priority: Medium  . HYPERLIPIDEMIA 07/01/2007    Priority: Medium  . Obstructive sleep apnea 07/01/2007    Priority: Medium  . Obesity, Class II, BMI 35-39.9 01/09/2009    Priority: Low  . Attention deficit disorder 07/29/2007    Priority: Low  . ALLERGIC RHINITIS 07/01/2007    Priority: Low  . History of colonic polyps 07/01/2007    Priority: Low   Past Surgical History  Procedure Laterality Date  . Appendectomy    . Vasectomy    . Hernia repair      inguinal-not sure of side    Family History  Problem Relation Age of Onset  . Heart disease Mother     CABG x2 at 6260. Surgery for "enlarged heart" later  . Hyperlipidemia Mother   . Diabetes Father   . Alzheimer's disease Father   . Colon cancer Brother     in 6740s  . Colon cancer Sister     3968  . ALS Brother     other brother    Medications- reviewed and updated Current Outpatient Prescriptions  Medication Sig Dispense Refill  . Omega-3 Krill Oil 300 MG CAPS Take 1  capsule (300 mg total) by mouth 2 (two) times daily.    Marland Kitchen. ibuprofen (ADVIL,MOTRIN) 200 MG tablet Take 200 mg by mouth every 6 (six) hours as needed.      Marland Kitchen. scopolamine (TRANSDERM-SCOP) 1 MG/3DAYS Place 1 patch (1.5 mg total) onto the skin every 3 (three) days. (Patient not taking: Reported on 09/27/2014) 10 patch 1  . [DISCONTINUED] amphetamine-dextroamphetamine (ADDERALL XR) 20 MG 24 hr capsule Take 1 capsule (20 mg total) by mouth every morning. 90 capsule 0  . [DISCONTINUED] rosuvastatin (CRESTOR) 10 MG tablet Take 10 mg by mouth daily.       No current facility-administered medications for this visit.    Allergies-reviewed and updated No Known Allergies  History   Social History  . Marital Status: Married    Spouse Name: N/A  . Number of Children: N/A  . Years of Education: N/A   Social History Main Topics  . Smoking status: Never Smoker   . Smokeless tobacco: Never Used     Comment: SMOKES CIGARS AT TIMES  . Alcohol Use: 2.4 oz/week    4 Glasses of wine per week     Comment: about 4 beers a week now instead of wine  . Drug Use: No  . Sexual Activity: Yes   Other Topics Concern  . None  Social History Narrative   Married (wife Annabelle Harman Armbrister patient of Dr. Durene Cal). 2 children-michael 32, katie 28 in 2016. No grandkids.       Works in Consulting civil engineer support, networking      Hobbies: build Therapist, music, sail, artist-painting, drawing, sculpture, pencil.     ROS--See HPI   Objective: BP 140/70 mmHg  Pulse 64  Temp(Src) 98 F (36.7 C)  Ht  (1.803 m)  Wt 278 lb (126.1 kg)  BMI 38.79 kg/m2 Gen: NAD, resting comfortably HEENT: Mucous membranes are moist. Oropharynx normal Neck: no thyromegaly CV: RRR no murmurs rubs or gallops Lungs: CTAB no crackles, wheeze, rhonchi Abdomen: soft/nontender/nondistended/normal bowel sounds. No rebound or guarding.  Opts no rectal this year Ext: no edema Skin: warm, dry Neuro: grossly normal, moves all extremities,  PERRLA  Assessment/Plan:  59 y.o. male presenting for annual physical.  Health Maintenance counseling: 1. Anticipatory guidance: Patient counseled regarding regular dental exams, wearing seatbelts, wearing sunscreen 2. Risk factor reduction:  Advised patient of need for regular exercise and diet rich and fruits and vegetables to reduce risk of heart attack and stroke.  3. Immunizations/screenings/ancillary studies Health Maintenance Due  Topic Date Due  . ZOSTAVAX - check with insurance 10/08/2013  4. Need colonoscopy report completed within a few years [facility] 5. Weight goal <260 in 1 year for BP, lipids. 10 year risk 11.9%- work on changes before starting BP and lipid meds.  6. Declines PSA today, considering every other year- will do some reading 7. History hematuria reports cystoscopy- need records.   CPE 1 year, home monitoring of BP and if not regularly <140/90 to return sooner (has cuff at home)  Fasting Results for orders placed or performed in visit on 09/27/14 (from the past 24 hour(s))  CBC     Status: None   Collection Time: 09/27/14  9:15 AM  Result Value Ref Range   WBC 8.2 4.0 - 10.5 K/uL   RBC 4.85 4.22 - 5.81 Mil/uL   Platelets 184.0 150.0 - 400.0 K/uL   Hemoglobin 14.3 13.0 - 17.0 g/dL   HCT 82.9 56.2 - 13.0 %   MCV 86.1 78.0 - 100.0 fl   MCHC 34.2 30.0 - 36.0 g/dL   RDW 86.5 78.4 - 69.6 %  Comprehensive metabolic panel     Status: Abnormal   Collection Time: 09/27/14  9:15 AM  Result Value Ref Range   Sodium 137 135 - 145 mEq/L   Potassium 4.0 3.5 - 5.1 mEq/L   Chloride 104 96 - 112 mEq/L   CO2 25 19 - 32 mEq/L   Glucose, Bld 91 70 - 99 mg/dL   BUN 25 (H) 6 - 23 mg/dL   Creatinine, Ser 2.95 0.40 - 1.50 mg/dL   Total Bilirubin 0.5 0.2 - 1.2 mg/dL   Alkaline Phosphatase 78 39 - 117 U/L   AST 16 0 - 37 U/L   ALT 18 0 - 53 U/L   Total Protein 6.7 6.0 - 8.3 g/dL   Albumin 4.1 3.5 - 5.2 g/dL   Calcium 9.3 8.4 - 28.4 mg/dL   GFR 13.24 >40.10 mL/min   Lipid panel     Status: Abnormal   Collection Time: 09/27/14  9:15 AM  Result Value Ref Range   Cholesterol 196 0 - 200 mg/dL   Triglycerides 272.5 (H) 0.0 - 149.0 mg/dL   HDL 36.64 (L) >40.34 mg/dL   VLDL 74.2 0.0 - 59.5 mg/dL   LDL Cholesterol 638 (H) 0 -  99 mg/dL   Total CHOL/HDL Ratio 5    NonHDL 157.90   TSH     Status: None   Collection Time: 09/27/14  9:15 AM  Result Value Ref Range   TSH 2.28 0.35 - 4.50 uIU/mL  Hemoglobin A1c     Status: None   Collection Time: 09/27/14  9:15 AM  Result Value Ref Range   Hgb A1c MFr Bld 5.2 4.6 - 6.5 %  POCT urinalysis dipstick     Status: None   Collection Time: 09/27/14 10:25 AM  Result Value Ref Range   Color, UA Yellow    Clarity, UA Clear    Glucose, UA Negative    Bilirubin, UA Negative    Ketones, UA Negative    Spec Grav, UA 1.025    Blood, UA 1+    pH, UA 5.5    Protein, UA Negative    Urobilinogen, UA 0.2    Nitrite, UA Negative    Leukocytes, UA Negative

## 2014-09-27 NOTE — Patient Instructions (Addendum)
Sign release of information at the front desk for records for your last colonoscopy- from [facility].   Health Maintenance Due  Topic Date Due  . ZOSTAVAX - ask your insurance about shingles vaccine if you decide you want it 10/08/2013    Fasting labs today  Weight goal in 1 year 260 or below. Weight watchers would be reasonable to try to add to your exercise efforts. I think this will help with your cholesterol and your blood pressure  Initial BP 140/70. Recheck  _____  See you in a year unless something comes up on labs or if you need me sooner

## 2017-06-11 ENCOUNTER — Telehealth: Payer: Self-pay

## 2017-06-11 NOTE — Telephone Encounter (Signed)
Okay to schedule NP/transfer of care appt at his earliest convenience.

## 2017-06-11 NOTE — Telephone Encounter (Signed)
Please advise 

## 2017-06-11 NOTE — Telephone Encounter (Signed)
Called left message that it is okay to schedule np appt with dr. Drue NovelPaz and to call us back to set up this appointment.

## 2017-06-11 NOTE — Telephone Encounter (Signed)
Copied from CRM [id number]. Topic: General - Other >> Jun 11, 2017  3:23 PM Stephannie LiSimmons, Janett L, NT wrote: Reason for CRM: Patient would like to transfer care to Dr  Drue NovelPaz from Dr Durene CalHunter please advise  [phone] please leave a detailed message

## 2017-06-11 NOTE — Telephone Encounter (Signed)
Ok with me thanks. Please tell patient thank you for allowing me to be his physician these last few years and I wish him the best.

## 2017-06-11 NOTE — Telephone Encounter (Signed)
Ok with me, thx 

## 2017-06-18 NOTE — Telephone Encounter (Signed)
Called pt again and left message for him to call us back.

## 2017-06-18 NOTE — Telephone Encounter (Signed)
Can we try calling Pt again to get established w/ Paz?

## 2017-08-16 DIAGNOSIS — J208 Acute bronchitis due to other specified organisms: Secondary | ICD-10-CM | POA: Diagnosis not present

## 2017-10-14 ENCOUNTER — Other Ambulatory Visit: Payer: Self-pay

## 2017-10-21 ENCOUNTER — Ambulatory Visit (INDEPENDENT_AMBULATORY_CARE_PROVIDER_SITE_OTHER): Payer: BLUE CROSS/BLUE SHIELD | Admitting: Internal Medicine

## 2017-10-21 ENCOUNTER — Encounter: Payer: Self-pay | Admitting: Internal Medicine

## 2017-10-21 VITALS — BP 128/78 | HR 73 | Temp 97.7°F | Resp 16 | Ht 71.0 in | Wt 279.1 lb

## 2017-10-21 DIAGNOSIS — Z0000 Encounter for general adult medical examination without abnormal findings: Secondary | ICD-10-CM | POA: Diagnosis not present

## 2017-10-21 DIAGNOSIS — I491 Atrial premature depolarization: Secondary | ICD-10-CM

## 2017-10-21 DIAGNOSIS — Z1159 Encounter for screening for other viral diseases: Secondary | ICD-10-CM

## 2017-10-21 DIAGNOSIS — Z23 Encounter for immunization: Secondary | ICD-10-CM

## 2017-10-21 DIAGNOSIS — E785 Hyperlipidemia, unspecified: Secondary | ICD-10-CM

## 2017-10-21 NOTE — Progress Notes (Signed)
Pre visit review using our clinic review tool, if applicable. No additional management support is needed unless otherwise documented below in the visit note. 

## 2017-10-21 NOTE — Progress Notes (Signed)
Subjective:    Patient ID: Jermaine Johnson, male    DOB: [birth year], 62 y.o.   MRN: [id number]  DOS:  10/21/2017 Type of visit - description : New patient, last visit 08/2014, CPX Interval history: No major concerns.   Review of Systems Admits he is overweight, not exercising much. Has nocturia, once at night, not bothersome. Occasional heel and knee pain, better with NSAIDs as needed  Other than above, a 14 point review of systems is negative    Past Medical History:  Diagnosis Date  . Allergy   . CALCANEAL SPUR, RIGHT 03/20/2010  . History of colon polyps   . Migraine   . ONYCHOMYCOSIS 11/12/2007  . ROTATOR CUFF INJURY, LEFT SHOULDER 07/29/2007    Past Surgical History:  Procedure Laterality Date  . APPENDECTOMY    . HERNIA REPAIR     inguinal-not sure of side  . TONSILLECTOMY AND ADENOIDECTOMY  1952  . VASECTOMY      Social History   Socioeconomic History  . Marital status: Married    Spouse name: Not on file  . Number of children: 2  . Years of education: Not on file  . Highest education level: Not on file  Occupational History  . Occupation: IT   Social Needs  . Financial resource strain: Not on file  . Food insecurity:    Worry: Not on file    Inability: Not on file  . Transportation needs:    Medical: Not on file    Non-medical: Not on file  Tobacco Use  . Smoking status: Never Smoker  . Smokeless tobacco: Never Used  . Tobacco comment: SMOKES a CIGAR sometimes   Substance and Sexual Activity  . Alcohol use: Yes    Alcohol/week: 2.4 oz    Types: 4 Glasses of wine per week    Comment: about 4 beers a week now instead of wine  . Drug use: No  . Sexual activity: Yes  Lifestyle  . Physical activity:    Days per week: Not on file    Minutes per session: Not on file  . Stress: Not on file  Relationships  . Social connections:    Talks on phone: Not on file    Gets together: Not on file    Attends religious service: Not on file    Active member  of club or organization: Not on file    Attends meetings of clubs or organizations: Not on file    Relationship status: Not on file  . Intimate partner violence:    Fear of current or ex partner: Not on file    Emotionally abused: Not on file    Physically abused: Not on file    Forced sexual activity: Not on file  Other Topics Concern  . Not on file  Social History Narrative   Married (wife Jermaine Johnson )   2 children-Jermaine Johnson  Jermaine Johnson      Works in Consulting civil engineer support, networking   Hobbies: build stuff, sail, artist-painting, drawing, sculpture, pencil.      Family History  Problem Relation Age of Onset  . Heart disease Mother        CABG x2 at 68. Surgery for "enlarged heart" later  . Hyperlipidemia Mother   . Diabetes Father   . Alzheimer's disease Father   . Colon cancer Brother        in 36s  . Colon cancer Sister        85  . ALS  Brother        other brother  . Prostate cancer Neg Hx      Allergies as of 10/21/2017   No Known Allergies     Medication List        Accurate as of 10/21/17 11:59 PM. Always use your most recent med list.          ibuprofen 200 MG tablet Commonly known as:  ADVIL,MOTRIN Take 200 mg by mouth every 6 (six) hours as needed.   naproxen 250 MG tablet Commonly known as:  NAPROSYN Take 250 mg by mouth 2 (two) times daily with a meal.   Omega-3 Krill Oil 300 MG Caps Take 1 capsule (300 mg total) by mouth 2 (two) times daily.          Objective:   Physical Exam BP 128/78 (BP Location: Left Arm, Patient Position: Sitting, Cuff Size: Normal)   Pulse 73   Temp 97.7 F (36.5 C) (Oral)   Resp 16   Ht 5\' 11"  (1.803 m)   Wt 279 lb 2 oz (126.6 kg)   SpO2 98%   BMI 38.93 kg/m  General: Well developed, NAD, see BMI.  Neck: No  thyromegaly  HEENT:  Normocephalic . Face symmetric, atraumatic Lungs:  CTA B Normal respiratory effort, no intercostal retractions, no accessory muscle use. Heart: RRR,  no murmur.  No pretibial edema  bilaterally  Abdomen:  Not distended, soft, non-tender. No rebound or rigidity.   Skin: Exposed areas without rash. Not pale. Not jaundice Rectal: External abnormalities: none. Normal sphincter tone. No rectal masses or tenderness.  No stools found Prostate: Prostate gland firm and smooth, no enlargement, nodularity, tenderness, mass, asymmetry or induration Neurologic:  alert & oriented X3.  Speech normal, gait appropriate for age and unassisted Strength symmetric and appropriate for age.  Psych: Cognition and judgment appear intact.  Cooperative with normal attention span and concentration.  Behavior appropriate. No anxious or depressed appearing.     Assessment & Plan:    Assessment Mild dislipidemia Dx w/ OSA , rx CPAP, not using 64 years as of 09/2017 Obesity + FH CAD, mother age 64 +FH colon ca: brother and sister  Plan: Dyslipidemia: Checking labs, encourage a healthy lifestyle OSA: dx remotely, at some point used a CPAP, has not use it in years due to "frequent URIs".  Epworth scale today: 5, negative.  Recommend weight loss RTC 1 year

## 2017-10-21 NOTE — Assessment & Plan Note (Addendum)
-   Td 2009 and 09/2017 - CCS: ++ FH cscope 1998, 2006; had 2 additional cscopes per pt  at   [facility] ?? Patient is aware is extremely important to know when is his next colonoscopy, reports he will call with the name of his last gastroenterologist. --+ FH CAD: Goal CV RF control, recommend aspirin. -- EKG  today: Frequent PACs.  Will check a echo and a Holter --prostate ca screening: DRE negative today, check a PSA --Diet and exercise discussed --Labs: Will come back fasting for CMP, FLP, CBC, A1c, TSH, PSA, hep C

## 2017-10-21 NOTE — Patient Instructions (Signed)
  GO TO THE FRONT DESK Schedule labs to be done fasting this week  Schedule your next appointment for a physical exam in 1 year.  Sooner if needed  Please call with the name of your last gastroenterologist or when you are due for your next colonoscopy.   GENERAL INFORMATION ABOUT HEALTHY EATING, CHOLESTEROL, HIGH BLOOD PRESSURE The American Heart Association, www.heart.org  Check the "Life's Simple 7" from the [facility] The American diabetes Association www.diabetes.org  "[facility] A to Z Health Guide" book

## 2017-10-22 DIAGNOSIS — Z09 Encounter for follow-up examination after completed treatment for conditions other than malignant neoplasm: Secondary | ICD-10-CM | POA: Insufficient documentation

## 2017-10-22 NOTE — Assessment & Plan Note (Signed)
Dyslipidemia: Checking labs, encourage a healthy lifestyle OSA: dx remotely, at some point used a CPAP, has not use it in years due to "frequent URIs".  Epworth scale today: 5, negative.  Recommend weight loss RTC 1 year

## 2017-10-23 ENCOUNTER — Other Ambulatory Visit (INDEPENDENT_AMBULATORY_CARE_PROVIDER_SITE_OTHER): Payer: BLUE CROSS/BLUE SHIELD

## 2017-10-23 DIAGNOSIS — Z1159 Encounter for screening for other viral diseases: Secondary | ICD-10-CM

## 2017-10-23 DIAGNOSIS — Z0000 Encounter for general adult medical examination without abnormal findings: Secondary | ICD-10-CM | POA: Diagnosis not present

## 2017-10-23 DIAGNOSIS — E785 Hyperlipidemia, unspecified: Secondary | ICD-10-CM

## 2017-10-23 LAB — CBC WITH DIFFERENTIAL/PLATELET
BASOS PCT: 0.6 % (ref 0.0–3.0)
Basophils Absolute: 0 10*3/uL (ref 0.0–0.1)
EOS ABS: 0.1 10*3/uL (ref 0.0–0.7)
Eosinophils Relative: 1.3 % (ref 0.0–5.0)
HCT: 41.6 % (ref 39.0–52.0)
HEMOGLOBIN: 14.3 g/dL (ref 13.0–17.0)
Lymphocytes Relative: 30.4 % (ref 12.0–46.0)
Lymphs Abs: 2.1 10*3/uL (ref 0.7–4.0)
MCHC: 34.4 g/dL (ref 30.0–36.0)
MCV: 86.4 fl (ref 78.0–100.0)
MONOS PCT: 8.1 % (ref 3.0–12.0)
Monocytes Absolute: 0.6 10*3/uL (ref 0.1–1.0)
NEUTROS PCT: 59.6 % (ref 43.0–77.0)
Neutro Abs: 4.1 10*3/uL (ref 1.4–7.7)
PLATELETS: 163 10*3/uL (ref 150.0–400.0)
RBC: 4.82 Mil/uL (ref 4.22–5.81)
RDW: 13.8 % (ref 11.5–15.5)
WBC: 6.9 10*3/uL (ref 4.0–10.5)

## 2017-10-23 LAB — COMPREHENSIVE METABOLIC PANEL
ALBUMIN: 4 g/dL (ref 3.5–5.2)
ALT: 17 U/L (ref 0–53)
AST: 15 U/L (ref 0–37)
Alkaline Phosphatase: 71 U/L (ref 39–117)
BUN: 21 mg/dL (ref 6–23)
CHLORIDE: 105 meq/L (ref 96–112)
CO2: 28 mEq/L (ref 19–32)
CREATININE: 1 mg/dL (ref 0.40–1.50)
Calcium: 9.2 mg/dL (ref 8.4–10.5)
GFR: 79.95 mL/min (ref >60.00)
Glucose, Bld: 99 mg/dL (ref 70–99)
Potassium: 4.3 mEq/L (ref 3.5–5.1)
SODIUM: 140 meq/L (ref 135–145)
TOTAL PROTEIN: 6.6 g/dL (ref 6.0–8.3)
Total Bilirubin: 0.6 mg/dL (ref 0.2–1.2)

## 2017-10-23 LAB — HEMOGLOBIN A1C: HEMOGLOBIN A1C: 5.3 % (ref 4.6–6.5)

## 2017-10-23 LAB — LIPID PANEL
CHOLESTEROL: 197 mg/dL (ref 0–200)
HDL: 34.3 mg/dL — ABNORMAL LOW (ref >39.00)
LDL CALC: 124 mg/dL — AB (ref 0–99)
NonHDL: 163.13
Total CHOL/HDL Ratio: 6
Triglycerides: 197 mg/dL — ABNORMAL HIGH (ref 0.0–149.0)
VLDL: 39.4 mg/dL (ref 0.0–40.0)

## 2017-10-23 LAB — TSH: TSH: 1.76 u[IU]/mL (ref 0.35–4.50)

## 2017-10-23 LAB — PSA: PSA: 3.48 ng/mL (ref 0.10–4.00)

## 2017-10-24 LAB — HEPATITIS C ANTIBODY
HEP C AB: NONREACTIVE
SIGNAL TO CUT-OFF: 0.02 (ref <1.00)

## 2017-11-05 ENCOUNTER — Encounter: Payer: Self-pay | Admitting: Internal Medicine

## 2017-11-06 ENCOUNTER — Encounter: Payer: Self-pay | Admitting: Internal Medicine

## 2017-11-06 ENCOUNTER — Other Ambulatory Visit (HOSPITAL_COMMUNITY): Payer: BLUE CROSS/BLUE SHIELD

## 2017-11-28 ENCOUNTER — Encounter: Payer: Self-pay | Admitting: Internal Medicine

## 2018-02-27 ENCOUNTER — Encounter: Payer: Self-pay | Admitting: Internal Medicine

## 2018-10-23 ENCOUNTER — Encounter: Payer: BLUE CROSS/BLUE SHIELD | Admitting: Internal Medicine

## 2018-10-23 DIAGNOSIS — K635 Polyp of colon: Secondary | ICD-10-CM | POA: Diagnosis not present

## 2018-10-23 DIAGNOSIS — Z86010 Personal history of colonic polyps: Secondary | ICD-10-CM | POA: Diagnosis not present

## 2018-10-23 LAB — HM COLONOSCOPY

## 2018-11-06 ENCOUNTER — Other Ambulatory Visit: Payer: Self-pay

## 2018-11-06 ENCOUNTER — Encounter: Payer: Self-pay | Admitting: Internal Medicine

## 2018-11-06 ENCOUNTER — Ambulatory Visit (INDEPENDENT_AMBULATORY_CARE_PROVIDER_SITE_OTHER): Payer: BC Managed Care – PPO | Admitting: Internal Medicine

## 2018-11-06 VITALS — BP 143/84 | HR 68 | Temp 98.1°F | Resp 16 | Ht 71.0 in | Wt 278.2 lb

## 2018-11-06 DIAGNOSIS — Z0000 Encounter for general adult medical examination without abnormal findings: Secondary | ICD-10-CM

## 2018-11-06 DIAGNOSIS — E785 Hyperlipidemia, unspecified: Secondary | ICD-10-CM | POA: Diagnosis not present

## 2018-11-06 DIAGNOSIS — H9193 Unspecified hearing loss, bilateral: Secondary | ICD-10-CM

## 2018-11-06 DIAGNOSIS — I493 Ventricular premature depolarization: Secondary | ICD-10-CM | POA: Diagnosis not present

## 2018-11-06 DIAGNOSIS — H9313 Tinnitus, bilateral: Secondary | ICD-10-CM

## 2018-11-06 DIAGNOSIS — Z23 Encounter for immunization: Secondary | ICD-10-CM

## 2018-11-06 LAB — CBC WITH DIFFERENTIAL/PLATELET
Basophils Absolute: 0 10*3/uL (ref 0.0–0.1)
Basophils Relative: 0.5 % (ref 0.0–3.0)
Eosinophils Absolute: 0.1 10*3/uL (ref 0.0–0.7)
Eosinophils Relative: 1.2 % (ref 0.0–5.0)
HCT: 43.8 % (ref 39.0–52.0)
Hemoglobin: 14.8 g/dL (ref 13.0–17.0)
Lymphocytes Relative: 39.1 % (ref 12.0–46.0)
Lymphs Abs: 2.6 10*3/uL (ref 0.7–4.0)
MCHC: 33.8 g/dL (ref 30.0–36.0)
MCV: 87.2 fl (ref 78.0–100.0)
Monocytes Absolute: 0.4 10*3/uL (ref 0.1–1.0)
Monocytes Relative: 5.9 % (ref 3.0–12.0)
Neutro Abs: 3.6 10*3/uL (ref 1.4–7.7)
Neutrophils Relative %: 53.3 % (ref 43.0–77.0)
Platelets: 191 10*3/uL (ref 150.0–400.0)
RBC: 5.02 Mil/uL (ref 4.22–5.81)
RDW: 13.4 % (ref 11.5–15.5)
WBC: 6.7 10*3/uL (ref 4.0–10.5)

## 2018-11-06 LAB — COMPREHENSIVE METABOLIC PANEL
ALT: 20 U/L (ref 0–53)
AST: 16 U/L (ref 0–37)
Albumin: 4.2 g/dL (ref 3.5–5.2)
Alkaline Phosphatase: 81 U/L (ref 39–117)
BUN: 18 mg/dL (ref 6–23)
CO2: 28 mEq/L (ref 19–32)
Calcium: 9 mg/dL (ref 8.4–10.5)
Chloride: 104 mEq/L (ref 96–112)
Creatinine, Ser: 0.98 mg/dL (ref 0.40–1.50)
GFR: 76.74 mL/min (ref >60.00)
Glucose, Bld: 88 mg/dL (ref 70–99)
Potassium: 4.4 mEq/L (ref 3.5–5.1)
Sodium: 140 mEq/L (ref 135–145)
Total Bilirubin: 0.6 mg/dL (ref 0.2–1.2)
Total Protein: 6.8 g/dL (ref 6.0–8.3)

## 2018-11-06 LAB — LIPID PANEL
Cholesterol: 196 mg/dL (ref 0–200)
HDL: 38.5 mg/dL — ABNORMAL LOW (ref >39.00)
LDL Cholesterol: 122 mg/dL — ABNORMAL HIGH (ref 0–99)
NonHDL: 157.61
Total CHOL/HDL Ratio: 5
Triglycerides: 178 mg/dL — ABNORMAL HIGH (ref 0.0–149.0)
VLDL: 35.6 mg/dL (ref 0.0–40.0)

## 2018-11-06 LAB — PSA: PSA: 3.57 ng/mL (ref 0.10–4.00)

## 2018-11-06 LAB — MAGNESIUM: Magnesium: 2 mg/dL (ref 1.5–2.5)

## 2018-11-06 NOTE — Progress Notes (Signed)
Pre visit review using our clinic review tool, if applicable. No additional management support is needed unless otherwise documented below in the visit note. 

## 2018-11-06 NOTE — Assessment & Plan Note (Addendum)
-   Td   09/2017 -prevnar today - shingrix discussed, #1 today - Recommend  flu shot this year - CCS: ++ FH;  cscope 1998, 2006; had 2 additional cscopes per pt ; last 10/23/2018, Dr Fausto Skillern, 1 polyp, hyperplastic, see Care Everywhere, next per GI --+ FH  CAD: Goal CV RF control, recommend aspirin, pt reluctant --prostate ca screening: DRE negative 2019, PSA in the high side of normal, recheck today --Diet and exercise discussed --Labs: CMP, FLP, CBC, PSA

## 2018-11-06 NOTE — Patient Instructions (Addendum)
GO TO THE LAB : Get the blood work     [place] Schedule a nurse visit in 2 to 3 months from today for your second dose of Shingrix  Schedule your next appointment   for a physical in 1 year

## 2018-11-06 NOTE — Progress Notes (Signed)
Subjective:    Patient ID: Jermaine Johnson, male    DOB: 20-Jul-1955, 63 y.o.   MRN: [id number]  DOS:  11/06/2018 Type of visit - description: cpx Here for CPX History of frequent PVCs on EKG, did not proceed with work-up. Reports many years history of bilateral mild hearing decrease and tinnitus.   Review of Systems   Other than above, a 14 point review of systems is negative    Past Medical History:  Diagnosis Date  . Allergy   . CALCANEAL SPUR, RIGHT 03/20/2010  . History of colon polyps   . Migraine   . ONYCHOMYCOSIS 11/12/2007  . ROTATOR CUFF INJURY, LEFT SHOULDER 07/29/2007    Past Surgical History:  Procedure Laterality Date  . APPENDECTOMY    . HERNIA REPAIR     inguinal-not sure of side  . TONSILLECTOMY AND ADENOIDECTOMY  1952  . VASECTOMY     Family History  Problem Relation Age of Onset  . Heart disease Mother        CABG x2 at 4060. Surgery for "enlarged heart" later  . Hyperlipidemia Mother   . Diabetes Father   . Alzheimer's disease Father   . Colon cancer Brother        in 8140s  . Colon cancer Sister        [id number]  . ALS Brother        other brother  . Prostate cancer Neg Hx     Social History   Socioeconomic History  . Marital status: Married    Spouse name: Not on file  . Number of children: 2  . Years of education: Not on file  . Highest education level: Not on file  Occupational History  . Occupation: IT   Social Needs  . Financial resource strain: Not on file  . Food insecurity    Worry: Not on file    Inability: Not on file  . Transportation needs    Medical: Not on file    Non-medical: Not on file  Tobacco Use  . Smoking status: Never Smoker  . Smokeless tobacco: Never Used  . Tobacco comment: SMOKES a CIGAR sometimes   Substance and Sexual Activity  . Alcohol use: Yes    Alcohol/week: 4.0 standard drinks    Types: 4 Glasses of wine per week    Comment: about 4 beers a week now instead of wine  . Drug use: No  . Sexual activity:  Yes  Lifestyle  . Physical activity    Days per week: Not on file    Minutes per session: Not on file  . Stress: Not on file  Relationships  . Social Musicianconnections    Talks on phone: Not on file    Gets together: Not on file    Attends religious service: Not on file    Active member of club or organization: Not on file    Attends meetings of clubs or organizations: Not on file    Relationship status: Not on file  . Intimate partner violence    Fear of current or ex partner: Not on file    Emotionally abused: Not on file    Physically abused: Not on file    Forced sexual activity: Not on file  Other Topics Concern  . Not on file  Social History Narrative   Married (wife Iran PlanasDana Johnson )   2 children-Jermaine Johnson  Jermaine Johnson      Works in Consulting civil engineerT support, networking   Hobbies: build  stuff, sail, artist-painting, drawing, sculpture, pencil.       Allergies as of 11/06/2018   No Known Allergies     Medication List       Accurate as of November 06, 2018 11:59 PM. If you have any questions, ask your nurse or doctor.        ibuprofen 200 MG tablet Commonly known as: ADVIL Take 200 mg by mouth every 6 (six) hours as needed.   naproxen 250 MG tablet Commonly known as: NAPROSYN Take 250 mg by mouth 2 (two) times daily with a meal.   Omega-3 Krill Oil 300 MG Caps Take 1 capsule (300 mg total) by mouth 2 (two) times daily.           Objective:   Physical Exam BP (!) 143/84 (BP Location: Left Arm, Patient Position: Sitting, Cuff Size: Small)   Pulse 68   Temp 98.1 F (36.7 C) (Oral)   Resp 16   Ht 5\' 11"  (1.803 m)   Wt 278 lb 4 oz (126.2 kg)   SpO2 98%   BMI 38.81 kg/m  General: Well developed, NAD, BMI noted Neck: No  thyromegaly  HEENT:  Normocephalic . Face symmetric, atraumatic Lungs:  CTA B Normal respiratory effort, no intercostal retractions, no accessory muscle use. Heart: RRR,  no murmur.  No pretibial edema bilaterally  Abdomen:  Not distended, soft, non-tender. No  rebound or rigidity.   Skin: Exposed areas without rash. Not pale. Not jaundice Neurologic:  alert & oriented X3.  Speech normal, gait appropriate for age and unassisted Strength symmetric and appropriate for age.  Psych: Cognition and judgment appear intact.  Cooperative with normal attention span and concentration.  Behavior appropriate. No anxious or depressed appearing.     Assessment       Assessment Mild dislipidemia Dx w/ OSA remotely , used a CPAP for short time, no CPAP as of 2020.  Using a wedge pillow Obesity + FH CAD, mother age 41 +FH colon ca: brother and sister  Plan: Mild dyslipidemia: Diet controlled, checking labs Frequent PACs: See last visit, recheck EKG today continue with frequent PACs.   Last year he was reluctant to proceed with echo and Holter monitor d/t  cost and because he said I was "selling the test".  I explained I have no financial interests when refer him to other offices or order  tests. Recommend cardiology evaluation for freq PACs.  Referral enter although the patient is somewhat skeptical about proceed Obesity: Diet and exercise discussed Mild bilateral tinnitus and decreased hearing: Offered ENT referral, declined it. NSAIDs: Reports that they take only sporadically RTC 1 year  Today in addition to his physical exam I spent more than 15 minutes discussing  other issues including: Dyslipidemia, frequent PAC (see comments)  , tinnitus.

## 2018-11-08 NOTE — Assessment & Plan Note (Addendum)
Plan: Mild dyslipidemia: Diet controlled, checking labs Frequent PACs: See last visit, recheck EKG today continue with frequent PACs.   Last year he was reluctant to proceed with echo and Holter monitor d/t  cost and because he said I was "selling the test".  I explained I have no financial interests when refer him to other offices or order  tests. Recommend cardiology evaluation for freq PACs.  Referral enter although the patient is somewhat skeptical about proceed Obesity: Diet and exercise discussed Mild bilateral tinnitus and decreased hearing: Offered ENT referral, declined it. NSAIDs: Reports that they take only sporadically RTC 1 year

## 2018-11-10 ENCOUNTER — Encounter: Payer: Self-pay | Admitting: Internal Medicine

## 2018-12-02 ENCOUNTER — Ambulatory Visit: Payer: BC Managed Care – PPO | Admitting: Cardiology

## 2019-01-26 ENCOUNTER — Ambulatory Visit (INDEPENDENT_AMBULATORY_CARE_PROVIDER_SITE_OTHER): Payer: BC Managed Care – PPO | Admitting: *Deleted

## 2019-01-26 ENCOUNTER — Other Ambulatory Visit: Payer: Self-pay

## 2019-01-26 DIAGNOSIS — Z23 Encounter for immunization: Secondary | ICD-10-CM | POA: Diagnosis not present

## 2019-01-26 NOTE — Progress Notes (Signed)
Patient in today for second shingles vaccine and flu shot.  Shingrix given in left deltoid and flu vaccine given in right deltoid.  Patient tolerated both vaccines well.

## 2019-01-27 ENCOUNTER — Ambulatory Visit: Payer: Self-pay

## 2019-01-27 NOTE — Telephone Encounter (Signed)
Patient called stating that he had shingles vaccine and flu vaccine yesterday at the office.  Last night his right arm was ache. He took his temperature and it was 99.9.  He treated both with Naproxin. He denies rash and  redness. Today he states he has no fever but his arms are sore and the injection site is slightly swollen. Home care advice was read to patient. He verbalized understanding and will call back if symptoms worsen.   Reason for Disposition . Injection site reaction to any vaccine  Answer Assessment - Initial Assessment Questions 1. SYMPTOMS: "What is the main symptom?" (e.g., redness, swelling, pain)      Sore ache arm rt arm, swollen, 2. ONSET: "When was the vaccine (shot) given?" "How much later did the soreness begin?" (e.g., hours, days ago)     yesterday 3. SEVERITY: "How bad is it?"      Fine now but rt shoulder ache rates at 1-2 4. FEVER: "Is there a fever?" If so, ask: "What is it, how was it measured, and when did it start?"      99.9 took naproxin home thermoneter 5. IMMUNIZATIONS GIVEN: "What shots have you recently received?"     Shingles, and flu 6. PAST REACTIONS: "Have you reacted to immunizations before?" If so, ask: "What happened?"    Just soreness 7. OTHER SYMPTOMS: "Do you have any other symptoms?"    No  Protocols used: IMMUNIZATION REACTIONS-A-AH

## 2019-01-27 NOTE — Telephone Encounter (Signed)
FYI

## 2019-01-27 NOTE — Telephone Encounter (Signed)
Noted, thank you

## 2019-07-16 DIAGNOSIS — M25561 Pain in right knee: Secondary | ICD-10-CM | POA: Diagnosis not present

## 2019-07-16 DIAGNOSIS — M1711 Unilateral primary osteoarthritis, right knee: Secondary | ICD-10-CM | POA: Diagnosis not present

## 2019-11-03 ENCOUNTER — Encounter: Payer: Self-pay | Admitting: Internal Medicine

## 2019-11-08 ENCOUNTER — Encounter: Payer: Self-pay | Admitting: Internal Medicine

## 2019-11-08 ENCOUNTER — Ambulatory Visit (INDEPENDENT_AMBULATORY_CARE_PROVIDER_SITE_OTHER): Payer: BC Managed Care – PPO | Admitting: Internal Medicine

## 2019-11-08 ENCOUNTER — Other Ambulatory Visit: Payer: Self-pay

## 2019-11-08 VITALS — BP 109/75 | HR 50 | Temp 97.8°F | Resp 16 | Ht 71.0 in | Wt 266.4 lb

## 2019-11-08 DIAGNOSIS — Z23 Encounter for immunization: Secondary | ICD-10-CM | POA: Diagnosis not present

## 2019-11-08 DIAGNOSIS — Z0000 Encounter for general adult medical examination without abnormal findings: Secondary | ICD-10-CM | POA: Diagnosis not present

## 2019-11-08 DIAGNOSIS — E785 Hyperlipidemia, unspecified: Secondary | ICD-10-CM | POA: Diagnosis not present

## 2019-11-08 DIAGNOSIS — Z125 Encounter for screening for malignant neoplasm of prostate: Secondary | ICD-10-CM | POA: Diagnosis not present

## 2019-11-08 LAB — LIPID PANEL
Cholesterol: 193 mg/dL (ref 0–200)
HDL: 38.2 mg/dL — ABNORMAL LOW (ref >39.00)
LDL Cholesterol: 120 mg/dL — ABNORMAL HIGH (ref 0–99)
NonHDL: 154.35
Total CHOL/HDL Ratio: 5
Triglycerides: 171 mg/dL — ABNORMAL HIGH (ref 0.0–149.0)
VLDL: 34.2 mg/dL (ref 0.0–40.0)

## 2019-11-08 LAB — CBC WITH DIFFERENTIAL/PLATELET
Basophils Absolute: 0 10*3/uL (ref 0.0–0.1)
Basophils Relative: 0.7 % (ref 0.0–3.0)
Eosinophils Absolute: 0.1 10*3/uL (ref 0.0–0.7)
Eosinophils Relative: 2.5 % (ref 0.0–5.0)
HCT: 39.8 % (ref 39.0–52.0)
Hemoglobin: 13.6 g/dL (ref 13.0–17.0)
Lymphocytes Relative: 36.4 % (ref 12.0–46.0)
Lymphs Abs: 2.2 10*3/uL (ref 0.7–4.0)
MCHC: 34.1 g/dL (ref 30.0–36.0)
MCV: 86 fl (ref 78.0–100.0)
Monocytes Absolute: 0.5 10*3/uL (ref 0.1–1.0)
Monocytes Relative: 7.8 % (ref 3.0–12.0)
Neutro Abs: 3.2 10*3/uL (ref 1.4–7.7)
Neutrophils Relative %: 52.6 % (ref 43.0–77.0)
Platelets: 171 10*3/uL (ref 150.0–400.0)
RBC: 4.63 Mil/uL (ref 4.22–5.81)
RDW: 14.1 % (ref 11.5–15.5)
WBC: 6 10*3/uL (ref 4.0–10.5)

## 2019-11-08 LAB — COMPREHENSIVE METABOLIC PANEL
ALT: 22 U/L (ref 0–53)
AST: 18 U/L (ref 0–37)
Albumin: 4.2 g/dL (ref 3.5–5.2)
Alkaline Phosphatase: 75 U/L (ref 39–117)
BUN: 25 mg/dL — ABNORMAL HIGH (ref 6–23)
CO2: 29 mEq/L (ref 19–32)
Calcium: 9.2 mg/dL (ref 8.4–10.5)
Chloride: 103 mEq/L (ref 96–112)
Creatinine, Ser: 1.05 mg/dL (ref 0.40–1.50)
GFR: 70.65 mL/min (ref >60.00)
Glucose, Bld: 107 mg/dL — ABNORMAL HIGH (ref 70–99)
Potassium: 4.6 mEq/L (ref 3.5–5.1)
Sodium: 137 mEq/L (ref 135–145)
Total Bilirubin: 0.6 mg/dL (ref 0.2–1.2)
Total Protein: 6.5 g/dL (ref 6.0–8.3)

## 2019-11-08 LAB — PSA: PSA: 3.4 ng/mL (ref 0.10–4.00)

## 2019-11-08 NOTE — Progress Notes (Signed)
Subjective:    Patient ID: Jermaine Johnson, male    DOB: Jul 14, 1955, 64 y.o.   MRN: [id number]  DOS:  11/08/2019 Type of visit - description: CPX No concerns  Wt Readings from Last 3 Encounters:  11/08/19 266 lb 6 oz (120.8 kg)  11/06/18 278 lb 4 oz (126.2 kg)  10/21/17 279 lb 2 oz (126.6 kg)     Review of Systems Specifically denies dysuria or gross hematuria.  He drinks plenty of water and urinates frequently but nothing unusual.  Occasionally has urinary urgency.  Other than above, a 14 point review of systems is negative    Past Medical History:  Diagnosis Date  . Allergy   . CALCANEAL SPUR, RIGHT 03/20/2010  . History of colon polyps   . Migraine   . ONYCHOMYCOSIS 11/12/2007  . ROTATOR CUFF INJURY, LEFT SHOULDER 07/29/2007    Past Surgical History:  Procedure Laterality Date  . APPENDECTOMY    . HERNIA REPAIR     inguinal-not sure of side  . TONSILLECTOMY AND ADENOIDECTOMY  1952  . VASECTOMY      Allergies as of 11/08/2019   No Known Allergies     Medication List       Accurate as of November 08, 2019 11:59 PM. If you have any questions, ask your nurse or doctor.        ibuprofen 200 MG tablet Commonly known as: ADVIL Take 200 mg by mouth every 6 (six) hours as needed.   naproxen 250 MG tablet Commonly known as: NAPROSYN Take 250 mg by mouth 2 (two) times daily with a meal.   Omega-3 Krill Oil 300 MG Caps Take 1 capsule (300 mg total) by mouth 2 (two) times daily.          Objective:   Physical Exam BP 109/75 (BP Location: Left Arm, Patient Position: Sitting, Cuff Size: Normal)   Pulse (!) 50   Temp 97.8 F (36.6 C) (Oral)   Resp 16   Ht 5\' 11"  (1.803 m)   Wt 266 lb 6 oz (120.8 kg)   SpO2 96%   BMI 37.15 kg/m  General: Well developed, NAD, BMI noted Neck: No  thyromegaly  HEENT:  Normocephalic . Face symmetric, atraumatic Lungs:  CTA B Normal respiratory effort, no intercostal retractions, no accessory muscle use. Heart: RRR,  no  murmur.  Abdomen:  Not distended, soft, non-tender. No rebound or rigidity.   Lower extremities: no pretibial edema bilaterally  Skin: Exposed areas without rash. Not pale. Not jaundice DRE: Normal stools, normal sphincter tone, prostate normal size, soft, nontender Neurologic:  alert & oriented X3.  Speech normal, gait appropriate for age and unassisted Strength symmetric and appropriate for age.  Psych: Cognition and judgment appear intact.  Cooperative with normal attention span and concentration.  Behavior appropriate. No anxious or depressed appearing.     Assessment      Assessment Dislipidemia Dx w/ OSA remotely , used a CPAP for short time, no CPAP as of 2020.  Using a wedge pillow Obesity + FH CAD, mother age 39 +FH colon ca: brother and sister DJD  PLAN Here for CPX Dyslipidemia: Based on last LDL (122) CV RF calculated at 17% (today 11.9%), in addition he has + FH CAD, I Rx statins, patient not taking them, benefits of statins be on cholesterol-lowering discussed.   Obesity: Doing well, since his retirement he is more active, has lost some weight. OSA: Denies snoring or fatigue.  Using wedge pillow.  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  Safety protocols were in place, including screening questions prior to the visit, additional usage of staff PPE, and extensive cleaning of exam room while observing appropriate contact time as indicated for disinfecting solutions.

## 2019-11-08 NOTE — Patient Instructions (Addendum)
Please call with your COVID-19 vaccine dates.    GO TO THE LAB : Get the blood work     GO TO THE FRONT DESK, PLEASE SCHEDULE YOUR APPOINTMENTS Come back for a physical exam in 1 year

## 2019-11-09 ENCOUNTER — Encounter: Payer: Self-pay | Admitting: Internal Medicine

## 2019-11-09 NOTE — Assessment & Plan Note (Signed)
-   Td   09/2017 - PNM 23: 2020 -PNM 23: 10-2019 - had covid vaccine - shingrix completed - Rec  flu shot this year - CCS: ++ FH;  cscope 1998, 2006; had 2 additional cscopes per pt ; last 10/23/2018, Dr Flo Shanks, 1 polyp, hyperplastic, see Care Everywhere, next per GI --+ FH  CAD: Goal is CV RF control, see comments under dyslipidemia --prostate ca screening: DRE negative today, PSA 3.57 a year ago, recheck PSA --Diet and exercise doing well, since retirement more active, some weight loss. --Labs: CMP, PSA, FLP, CBC

## 2019-11-09 NOTE — Assessment & Plan Note (Signed)
Here for CPX Dyslipidemia: Based on last LDL (122) CV RF calculated at 17% (today 11.9%), in addition he has + FH CAD, I Rx statins, patient not taking them, benefits of statins be on cholesterol-lowering discussed.   Obesity: Doing well, since his retirement he is more active, has lost some weight. OSA: Denies snoring or fatigue.  Using wedge pillow. 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

## 2019-11-12 ENCOUNTER — Encounter: Payer: Self-pay | Admitting: Internal Medicine

## 2019-11-17 ENCOUNTER — Encounter: Payer: BC Managed Care – PPO | Admitting: Internal Medicine

## 2020-02-11 ENCOUNTER — Telehealth: Payer: Self-pay | Admitting: Internal Medicine

## 2020-02-11 NOTE — Telephone Encounter (Signed)
Patient states he has cold sx,mucus has color to it . patient is requesting that Dr.Paz send in a ZPAK to pharmacy.

## 2020-02-11 NOTE — Telephone Encounter (Signed)
Needs an appt

## 2020-02-14 ENCOUNTER — Telehealth: Payer: Self-pay | Admitting: Internal Medicine

## 2020-02-14 NOTE — Telephone Encounter (Signed)
ERROR

## 2020-02-14 NOTE — Telephone Encounter (Signed)
CallerAnnabelle Johnson  Call Back # [phone]  Pt's wife called in today to follow up on request to have an antibiotic be sent in, though he had not been seen yet.  Pt's wife also requested that we transfer care to another provider here at our clinic, and was informed that we are not allowed to do so per [facility]. Her husband is scheduled with Dr Drue Novel for a virtual visit tomorrow afternoon per her request for cold/flu like symptoms   Thank you

## 2020-02-15 ENCOUNTER — Emergency Department (HOSPITAL_COMMUNITY)
Admission: EM | Admit: 2020-02-15 | Discharge: 2020-02-15 | Disposition: A | Discharge status: Home / Self Care | Payer: BC Managed Care – PPO | Attending: Emergency Medicine | Admitting: Emergency Medicine

## 2020-02-15 ENCOUNTER — Telehealth: Payer: BC Managed Care – PPO | Admitting: Internal Medicine

## 2020-02-15 ENCOUNTER — Emergency Department (HOSPITAL_COMMUNITY): Payer: BC Managed Care – PPO

## 2020-02-15 ENCOUNTER — Other Ambulatory Visit: Payer: Self-pay

## 2020-02-15 ENCOUNTER — Encounter (HOSPITAL_COMMUNITY): Payer: Self-pay | Admitting: *Deleted

## 2020-02-15 ENCOUNTER — Telehealth: Payer: BC Managed Care – PPO | Admitting: Medical

## 2020-02-15 DIAGNOSIS — Z8669 Personal history of other diseases of the nervous system and sense organs: Secondary | ICD-10-CM | POA: Insufficient documentation

## 2020-02-15 DIAGNOSIS — G8929 Other chronic pain: Secondary | ICD-10-CM

## 2020-02-15 DIAGNOSIS — Z20822 Contact with and (suspected) exposure to covid-19: Secondary | ICD-10-CM | POA: Insufficient documentation

## 2020-02-15 DIAGNOSIS — J069 Acute upper respiratory infection, unspecified: Secondary | ICD-10-CM | POA: Diagnosis not present

## 2020-02-15 DIAGNOSIS — M25561 Pain in right knee: Secondary | ICD-10-CM | POA: Diagnosis present

## 2020-02-15 DIAGNOSIS — M25562 Pain in left knee: Secondary | ICD-10-CM | POA: Diagnosis not present

## 2020-02-15 LAB — RESPIRATORY PANEL BY RT PCR (FLU A&B, COVID)
Influenza A by PCR: NEGATIVE
Influenza B by PCR: NEGATIVE
SARS Coronavirus 2 by RT PCR: NEGATIVE

## 2020-02-15 MED ORDER — HYDROCODONE-ACETAMINOPHEN 5-325 MG PO TABS: 1.0000 | ORAL_TABLET | ORAL | 0 refills | Status: DC | PRN

## 2020-02-15 MED ORDER — AZITHROMYCIN 250 MG PO TABS
250.0000 mg | ORAL_TABLET | Freq: Every day | ORAL | 0 refills | Status: DC
Start: 2020-02-15 — End: 2020-06-23

## 2020-02-15 MED ORDER — PREDNISONE 50 MG PO TABS: 50.0000 mg | ORAL_TABLET | Freq: Every day | ORAL | 0 refills | Status: DC

## 2020-02-15 NOTE — Discharge Instructions (Signed)
Take the medications as prescribed.  I have written you antibiotic for bronchitis as well as some steroids which would help with your upper respiratory infection and your knee pain.  If written you for a short course of narcotic pain medicine for your knee pain.  Do not drive or operate heavy machinery while taking this.  Only take as needed.  Make sure to follow-up with orthopedics for reevaluation  Covid test was negative.  Your upper respiratory symptoms are likely bronchitis.

## 2020-02-15 NOTE — ED Triage Notes (Signed)
Pt complains of pain in bilateral knees. He has a history of knee pain, sees orthopedic. He sat down to eat today and reports pain that was much worse.   Pt also reports congestion, productive cough. He has been taking mucinex and delysum. He He states his cold-like symptoms have been improving.

## 2020-02-15 NOTE — ED Provider Notes (Signed)
[facility]-EMERGENCY DEPT Provider Note   CSN: [id number] Arrival date & time: 02/15/20  1033    History Chief Complaint  Patient presents with  . Knee Pain  . Cough  . Nasal Congestion    Jermaine Johnson is a 64 y.o. male with history significant for migraines, metabolic syndrome chronic knee pain who presents for evaluation of multiple complaints.  Patient states he has chronic knee pain.  Had noted to be worsening over the last 3 days.  He had tried taking Indocin which is prescribed to him by orthopedics previously.  Patient states that work to the first 2 days her today has not been working.  Patient describes sharp, bilateral knee pain with going from sitting to standing or standing to sitting.  Patient states if he is still his knees do not hurt.  Not noted any additional redness, swelling, warmth.  States this feels like his typical knee pain however "really really bad."  Patient states he had episode and went to sit down and had such bad pain he felt hot and sweaty.  No syncope.  No headache, lightness, dizziness, chest pain, paresthesias.  Patient also noted that over the last week he has had chills, congestion, rhinorrhea.  Patient also with scratchy throat.  He is fully vaccinated against Covid.  Congestion and rhinorrhea, green purulent in nature.  Nonproductive cough.  He denies any fever, chills, chest pain, shortness of breath, hemoptysis, unilateral leg swelling, redness, warmth.  Has not taken anything for symptoms.  Denies issue aggravating or alleviating factors  History obtained from patient and past medical records.  No interpreter is used.  HPI     Past Medical History:  Diagnosis Date  . Allergy   . CALCANEAL SPUR, RIGHT 03/20/2010  . History of colon polyps   . Migraine   . ONYCHOMYCOSIS 11/12/2007  . ROTATOR CUFF INJURY, LEFT SHOULDER 07/29/2007    Patient Active Problem List   Diagnosis Date Noted  . PCP >>>>>>>>>>>>>>>>> 10/22/2017    . Annual physical exam 10/21/2017  . Obesity, Class II, BMI 35-39.9 01/09/2009  . DYSMETABOLIC SYNDROME 02/11/2008  . Attention deficit disorder 07/29/2007  . Dyslipidemia 07/01/2007  . Obstructive sleep apnea 07/01/2007  . ALLERGIC RHINITIS 07/01/2007  . History of colonic polyps 07/01/2007    Past Surgical History:  Procedure Laterality Date  . APPENDECTOMY    . HERNIA REPAIR     inguinal-not sure of side  . TONSILLECTOMY AND ADENOIDECTOMY  1952  . VASECTOMY         Family History  Problem Relation Age of Onset  . Heart disease Mother        CABG x2 at 80. Surgery for "enlarged heart" later  . Hyperlipidemia Mother   . Diabetes Father   . Alzheimer's disease Father   . Colon cancer Brother        in 47s  . Colon cancer Sister        82  . ALS Brother        other brother  . Prostate cancer Neg Hx     Social History   Tobacco Use  . Smoking status: Never Smoker  . Smokeless tobacco: Never Used  . Tobacco comment: SMOKES a CIGAR sometimes   Substance Use Topics  . Alcohol use: Yes    Alcohol/week: 4.0 standard drinks    Types: 4 Glasses of wine per week    Comment: about 4 beers a week now instead of wine  .  Drug use: No    Home Medications Prior to Admission medications   Medication Sig Start Date End Date Taking? Authorizing Provider  azithromycin (ZITHROMAX) 250 MG tablet Take 1 tablet (250 mg total) by mouth daily. Take first 2 tablets together, then 1 every day until finished. 02/15/20   Jamaia Brum A, PA-C  HYDROcodone-acetaminophen (NORCO/VICODIN) 5-325 MG tablet Take 1 tablet by mouth every 4 (four) hours as needed. 02/15/20   Cortlin Marano A, PA-C  ibuprofen (ADVIL,MOTRIN) 200 MG tablet Take 200 mg by mouth every 6 (six) hours as needed.      [provider]  naproxen (NAPROSYN) 250 MG tablet Take 250 mg by mouth 2 (two) times daily with a meal.    [provider]  Omega-3 Krill Oil 300 MG CAPS Take 1 capsule (300 mg  total) by mouth 2 (two) times daily. 01/25/13   Stacie Glaze, MD  predniSONE (DELTASONE) 50 MG tablet Take 1 tablet (50 mg total) by mouth daily. 02/15/20   Rodell Marrs A, PA-C    Allergies    Patient has no known allergies.  Review of Systems   Review of Systems  Constitutional: Negative.   HENT: Positive for congestion, postnasal drip, rhinorrhea, sinus pressure and sinus pain. Negative for dental problem, drooling, ear discharge, ear pain, facial swelling, hearing loss, mouth sores, nosebleeds, sneezing, sore throat, trouble swallowing and voice change.   Respiratory: Positive for cough. Negative for apnea, choking, chest tightness, shortness of breath, wheezing and stridor.   Cardiovascular: Negative.   Gastrointestinal: Negative.   Genitourinary: Negative.   Musculoskeletal: Negative for arthralgias, back pain, gait problem, joint swelling, myalgias, neck pain and neck stiffness.       Bilateral knee pain  Skin: Negative.   Neurological: Negative.   All other systems reviewed and are negative.   Physical Exam Updated Vital Signs BP 115/88 (BP Location: Left Arm)   Pulse 92   Temp 99 F (37.2 C) (Oral)   Resp 18   SpO2 99%   Physical Exam Vitals and nursing note reviewed.  Constitutional:      General: He is not in acute distress.    Appearance: He is not ill-appearing, toxic-appearing or diaphoretic.  HENT:     Head: Normocephalic and atraumatic.     Jaw: There is normal jaw occlusion.     Right Ear: Tympanic membrane, ear canal and external ear normal. There is no impacted cerumen. No hemotympanum. Tympanic membrane is not injected, scarred, perforated, erythematous, retracted or bulging.     Left Ear: Tympanic membrane, ear canal and external ear normal. There is no impacted cerumen. No hemotympanum. Tympanic membrane is not injected, scarred, perforated, erythematous, retracted or bulging.     Ears:     Comments: No Mastoid tenderness.    Nose:     Comments:  Clear rhinorrhea and congestion to bilateral nares.  No sinus tenderness.    Mouth/Throat:     Comments: Posterior oropharynx clear.  Mucous membranes moist.  Tonsils without erythema or exudate.  Uvula midline without deviation.  No evidence of PTA or RPA.  No drooling, dysphasia or trismus.  Phonation normal. Neck:     Trachea: Trachea and phonation normal.     Meningeal: Brudzinski's sign and Kernig's sign absent.     Comments: No Neck stiffness or neck rigidity.  No meningismus.  No cervical lymphadenopathy. Cardiovascular:     Pulses:          Dorsalis pedis pulses are 2+  on the right side and 2+ on the left side.       Posterior tibial pulses are 2+ on the right side and 2+ on the left side.     Comments: No murmurs rubs or gallops. Pulmonary:     Comments: Clear to auscultation bilaterally without wheeze, rhonchi or rales.  No accessory muscle usage.  Able speak in full sentences. Abdominal:     Comments: Soft, nontender without rebound or guarding.  No CVA tenderness.  Musculoskeletal:     Comments: Range of motion bilateral knees.  Crepitus with flexion and extension to bilateral knees.  No bony tenderness to tibia, fibula, bilateral femurs.  No overlying skin changes.  No joint effusion  Feet:     Right foot:     Skin integrity: Skin integrity normal.     Left foot:     Skin integrity: Skin integrity normal.  Skin:    General: Skin is warm.     Capillary Refill: Capillary refill takes less than 2 seconds.     Comments: Brisk capillary refill.  No rashes or lesions.  No edema, erythema or warmth.  No fluctuance or induration.  Neurological:     Mental Status: He is alert.     Cranial Nerves: Cranial nerves are intact.     Sensory: Sensation is intact.     Motor: Motor function is intact.     Coordination: Coordination is intact.     Gait: Gait is intact.     Deep Tendon Reflexes: Reflexes are normal and symmetric.     Comments: Ambulatory in department without difficulty.   Cranial nerves II through XII grossly intact.  No facial droop.  No aphasia.    ED Results / Procedures / Treatments   Labs (all labs ordered are listed, but only abnormal results are displayed) Labs Reviewed  RESPIRATORY PANEL BY RT PCR (FLU A&B, COVID)    EKG None  Radiology DG Chest Portable 1 View  Result Date: 02/15/2020 CLINICAL DATA:  Cough EXAM: PORTABLE CHEST 1 VIEW COMPARISON:  None. FINDINGS: Cardiac silhouette is borderline enlarged and accentuated by low lung volumes and portable technique. No consolidation. No visible pleural effusions or pneumothorax. The visualized skeletal structures are unremarkable. IMPRESSION: 1. No acute cardiopulmonary disease. 2. Borderline cardiomegaly. Electronically Signed   By: Feliberto HartsFrederick S Jones MD   On: 02/15/2020 12:57    Procedures Procedures (including critical care time)  Medications Ordered in ED Medications - No data to display  ED Course  I have reviewed the triage vital signs and the nursing notes.  Pertinent labs & imaging results that were available during my care of the patient were reviewed by me and considered in my medical decision making (see chart for details).  64 year old presents for evaluation multiple complaints.  He is afebrile, nonseptic, non-ill-appearing.   Patient with upper respiratory symptoms.  Vaccinations COVID.  Green purulent rhinorrhea, congestion.  Nonproductive cough.  No associated chest pain, shortness of breath.  He is without tachycardia, tachypnea or hypoxia.  No unilateral leg swelling, redness or warmth.  COVID, flu negative.  Given purulent rhinorrhea DC home with azithromycin.  X-ray reassuring without infiltrates, pulmonary edema, pneumothorax  Patient also with bilateral knee pain.  Has history of chronic knee pain however worsening over the last 3 days.  No recent injury or trauma.  Pain worse with going up stairs, flexion, extension, particularly going from sitting to standing or standing  to sitting.  He has no overlying skin changes  to extremities.  He has neuromusculoskeletal exam aside from bilateral crepitus.  Has history of bilateral knee arthritis.  No bony tenderness to bilateral femurs, tibia, fibula.  No obvious joint effusion.  He is ambulatory that difficulty.  Compartments soft.  No tenderness to bilateral popliteal fossa.  I have low suspicion for septic joint, gout, hemarthrosis, myositis, VTE, fracture, dislocation.  Likely acute on chronic pain.  Was given steroids and discussed close follow-up with orthopedics.  He is agreeable to this.  Patient ambulatory without difficulty.  The patient has been appropriately medically screened and/or stabilized in the ED. I have low suspicion for any other emergent medical condition which would require further screening, evaluation or treatment in the ED or require inpatient management.  Patient is hemodynamically stable and in no acute distress.  Patient able to ambulate in department prior to ED.  Evaluation does not show acute pathology that would require ongoing or additional emergent interventions while in the emergency department or further inpatient treatment.  I have discussed the diagnosis with the patient and answered all questions.  Pain is been managed while in the emergency department and patient has no further complaints prior to discharge.  Patient is comfortable with plan discussed in room and is stable for discharge at this time.  I have discussed strict return precautions for returning to the emergency department.  Patient was encouraged to follow-up with PCP/specialist refer to at discharge.     MDM Rules/Calculators/A&P                          DEDRIC ETHINGTON was evaluated in Emergency Department on 02/15/2020 for the symptoms described in the history of present illness. He was evaluated in the context of the global COVID-19 pandemic, which necessitated consideration that the patient might be at risk for infection  with the SARS-CoV-2 virus that causes COVID-19. Institutional protocols and algorithms that pertain to the evaluation of patients at risk for COVID-19 are in a state of rapid change based on information released by regulatory bodies including the CDC and federal and state organizations. These policies and algorithms were followed during the patient's care in the ED. Final Clinical Impression(s) / ED Diagnoses Final diagnoses:  Chronic pain of both knees  Viral upper respiratory tract infection    Rx / DC Orders ED Discharge Orders         Ordered    HYDROcodone-acetaminophen (NORCO/VICODIN) 5-325 MG tablet  Every 4 hours PRN        02/15/20 1343    predniSONE (DELTASONE) 50 MG tablet  Daily        02/15/20 1343    azithromycin (ZITHROMAX) 250 MG tablet  Daily        02/15/20 1343           Jalexus Brett A, PA-C 02/15/20 1503    Vanetta Mulders, MD 02/16/20 1020

## 2020-02-24 ENCOUNTER — Telehealth: Payer: BC Managed Care – PPO | Admitting: Emergency Medicine

## 2020-02-24 DIAGNOSIS — R053 Chronic cough: Secondary | ICD-10-CM

## 2020-02-24 MED ORDER — ALBUTEROL SULFATE HFA 108 (90 BASE) MCG/ACT IN AERS: 2.0000 | INHALATION_SPRAY | Freq: Four times a day (QID) | RESPIRATORY_TRACT | 0 refills | Status: DC | PRN

## 2020-02-24 MED ORDER — PROMETHAZINE-DM 6.25-15 MG/5ML PO SYRP: 5.0000 mL | ORAL_SOLUTION | Freq: Four times a day (QID) | ORAL | 0 refills | Status: DC | PRN

## 2020-02-24 NOTE — Progress Notes (Signed)
Time spent : 10 min  We are sorry that you are not feeling well.  Here is how we plan to help!  Based on your presentation I believe you most likely have persistent cough.  Sometimes a cough may linger for several weeks after a viral or bacteria infection of the lungs.    I have prescribed cough medicine Promethazine DM.  Additionally, sometimes after a bout of bronchitis or a viral upper respiratory infection, your airways can spasm "bronchospasm".  You may find albuterol inhaler 2 puffs every 6 hours and before bedtime helpful with bouts of cough.   From your responses in the eVisit questionnaire you describe inflammation in the upper respiratory tract which is causing a significant cough.  This is commonly called Bronchitis and has four common causes:    Allergies  Viral Infections  Acid Reflux  Bacterial Infection Allergies, viruses and acid reflux are treated by controlling symptoms or eliminating the cause. An example might be a cough caused by taking certain blood pressure medications. You stop the cough by changing the medication. Another example might be a cough caused by acid reflux. Controlling the reflux helps control the cough.  USE OF BRONCHODILATOR ("RESCUE") INHALERS: There is a risk from using your bronchodilator too frequently.  The risk is that over-reliance on a medication which only relaxes the muscles surrounding the breathing tubes can reduce the effectiveness of medications prescribed to reduce swelling and congestion of the tubes themselves.  Although you feel brief relief from the bronchodilator inhaler, your asthma may actually be worsening with the tubes becoming more swollen and filled with mucus.  This can delay other crucial treatments, such as oral steroid medications. If you need to use a bronchodilator inhaler daily, several times per day, you should discuss this with your provider.  There are probably better treatments that could be used to keep your asthma  under control.     HOME CARE . Only take medications as instructed by your medical team. . Complete the entire course of an antibiotic. . Drink plenty of fluids and get plenty of rest. . Avoid close contacts especially the very young and the elderly . Cover your mouth if you cough or cough into your sleeve. . Always remember to wash your hands . A steam or ultrasonic humidifier can help congestion.   GET HELP RIGHT AWAY IF: . You develop worsening fever. . You become short of breath . You cough up blood. . Your symptoms persist after you have completed your treatment plan MAKE SURE YOU   Understand these instructions.  Will watch your condition.  Will get help right away if you are not doing well or get worse.  Your e-visit answers were reviewed by a board certified advanced clinical practitioner to complete your personal care plan.  Depending on the condition, your plan could have included both over the counter or prescription medications. If there is a problem please reply  once you have received a response from your provider. Your safety is important to [place].  If you have drug allergies check your prescription carefully.    You can use MyChart to ask questions about today's visit, request a non-urgent call back, or ask for a work or school excuse for 24 hours related to this e-Visit. If it has been greater than 24 hours you will need to follow up with your provider, or enter a new e-Visit to address those concerns. You will get an e-mail in the next two days asking  about your experience.  I hope that your e-visit has been valuable and will speed your recovery. Thank you for using e-visits.  I hope you feel better,   Sharen Heck, PA-C [facility]

## 2020-03-03 ENCOUNTER — Telehealth: Payer: Self-pay

## 2020-03-03 ENCOUNTER — Encounter: Payer: Self-pay | Admitting: Internal Medicine

## 2020-03-03 NOTE — Telephone Encounter (Signed)
Form completed. Emailed back to Pt at [email or web address]. Form sent for scanning.

## 2020-06-23 ENCOUNTER — Telehealth: Payer: Self-pay

## 2020-06-23 ENCOUNTER — Ambulatory Visit (INDEPENDENT_AMBULATORY_CARE_PROVIDER_SITE_OTHER): Payer: BC Managed Care – PPO | Admitting: Internal Medicine

## 2020-06-23 ENCOUNTER — Encounter: Payer: Self-pay | Admitting: Internal Medicine

## 2020-06-23 ENCOUNTER — Other Ambulatory Visit: Payer: Self-pay

## 2020-06-23 VITALS — BP 142/82 | HR 56 | Temp 97.7°F | Resp 16 | Ht 71.0 in | Wt 265.1 lb

## 2020-06-23 DIAGNOSIS — Z125 Encounter for screening for malignant neoplasm of prostate: Secondary | ICD-10-CM | POA: Diagnosis not present

## 2020-06-23 DIAGNOSIS — E785 Hyperlipidemia, unspecified: Secondary | ICD-10-CM

## 2020-06-23 DIAGNOSIS — R739 Hyperglycemia, unspecified: Secondary | ICD-10-CM | POA: Diagnosis not present

## 2020-06-23 DIAGNOSIS — Z0000 Encounter for general adult medical examination without abnormal findings: Secondary | ICD-10-CM

## 2020-06-23 DIAGNOSIS — Z23 Encounter for immunization: Secondary | ICD-10-CM

## 2020-06-23 LAB — BASIC METABOLIC PANEL
BUN: 20 mg/dL (ref 6–23)
CO2: 29 mEq/L (ref 19–32)
Calcium: 9.3 mg/dL (ref 8.4–10.5)
Chloride: 105 mEq/L (ref 96–112)
Creatinine, Ser: 0.99 mg/dL (ref 0.40–1.50)
GFR: 79.27 mL/min (ref >60.00)
Glucose, Bld: 92 mg/dL (ref 70–99)
Potassium: 4.3 mEq/L (ref 3.5–5.1)
Sodium: 139 mEq/L (ref 135–145)

## 2020-06-23 LAB — PSA: PSA: 3.58 ng/mL (ref 0.10–4.00)

## 2020-06-23 LAB — LIPID PANEL
Cholesterol: 199 mg/dL (ref 0–200)
HDL: 41.2 mg/dL (ref >39.00)
LDL Cholesterol: 126 mg/dL — ABNORMAL HIGH (ref 0–99)
NonHDL: 158.29
Total CHOL/HDL Ratio: 5
Triglycerides: 162 mg/dL — ABNORMAL HIGH (ref 0.0–149.0)
VLDL: 32.4 mg/dL (ref 0.0–40.0)

## 2020-06-23 LAB — HEMOGLOBIN A1C: Hgb A1c MFr Bld: 5.3 % (ref 4.6–6.5)

## 2020-06-23 MED ORDER — JUBLIA 10 % EX SOLN: 1.0000 "application " | Freq: Every day | CUTANEOUS | 10 refills | Status: DC

## 2020-06-23 MED ORDER — CICLOPIROX 8 % EX SOLN: Freq: Every day | CUTANEOUS | 11 refills | Status: AC

## 2020-06-23 NOTE — Telephone Encounter (Signed)
Ciclopirox Apply daily for a year. Every 7 days remove all residual medication and debris with a alcohol swab. Send a month with 10 refill

## 2020-06-23 NOTE — Telephone Encounter (Signed)
Jublia not covered by [employer]. Preferred alternatives: terbinafine, fluconazole, ciclopirox, tavaborole, ketoconazole, Ciclodan, Exelderm, or clotrimazole

## 2020-06-23 NOTE — Progress Notes (Signed)
Pre visit review using our clinic review tool, if applicable. No additional management support is needed unless otherwise documented below in the visit note. 

## 2020-06-23 NOTE — Progress Notes (Signed)
Subjective:    Patient ID: Jermaine Johnson, male    DOB: 1955/11/11, 65 y.o.   MRN: [id number]  DOS:  06/23/2020 Type of visit - description: CPX  Since the last office visit he is doing well. The right great toe is dystrophic his wife is asking for him to be treated.  Previously diagnosed with onychomycosis and the area cleared with oral medicines.     BP Readings from Last 3 Encounters:  06/23/20 (!) 142/82  02/15/20 115/88  11/08/19 109/75     Review of Systems  Other than above, a 14 point review of systems is negative      Past Medical History:  Diagnosis Date  . Allergy   . CALCANEAL SPUR, RIGHT 03/20/2010  . History of colon polyps   . Migraine   . ONYCHOMYCOSIS 11/12/2007  . ROTATOR CUFF INJURY, LEFT SHOULDER 07/29/2007    Past Surgical History:  Procedure Laterality Date  . APPENDECTOMY    . HERNIA REPAIR     inguinal-not sure of side  . TONSILLECTOMY AND ADENOIDECTOMY  1952  . VASECTOMY      Allergies as of 06/23/2020   No Known Allergies     Medication List       Accurate as of June 23, 2020 11:59 PM. If you have any questions, ask your nurse or doctor.        STOP taking these medications   azithromycin 250 MG tablet Commonly known as: ZITHROMAX Stopped by: Willow Ora, MD   HYDROcodone-acetaminophen 5-325 MG tablet Commonly known as: NORCO/VICODIN Stopped by: Willow Ora, MD   predniSONE 50 MG tablet Commonly known as: DELTASONE Stopped by: Willow Ora, MD   promethazine-dextromethorphan 6.25-15 MG/5ML syrup Commonly known as: PROMETHAZINE-DM Stopped by: Willow Ora, MD     TAKE these medications   albuterol 108 (90 Base) MCG/ACT inhaler Commonly known as: VENTOLIN HFA Inhale 2 puffs into the lungs every 6 (six) hours as needed for wheezing or shortness of breath.   ciclopirox 8 % solution Commonly known as: PENLAC Apply topically at bedtime. Apply over nail and surrounding skin. Apply daily over previous coat. After seven (7) days, may  remove with alcohol and continue cycle. Use for daily for 1 year. Started by: Valentino Nose, CMA   ibuprofen 200 MG tablet Commonly known as: ADVIL Take 200 mg by mouth every 6 (six) hours as needed.   naproxen 250 MG tablet Commonly known as: NAPROSYN Take 250 mg by mouth 2 (two) times daily with a meal.   Omega-3 Krill Oil 300 MG Caps Take 1 capsule (300 mg total) by mouth 2 (two) times daily.          Objective:   Physical Exam BP (!) 142/82 (BP Location: Left Arm, Patient Position: Sitting, Cuff Size: Normal)   Pulse (!) 56   Temp 97.7 F (36.5 C) (Oral)   Resp 16   Ht 5\' 11"  (1.803 m)   Wt 265 lb 2 oz (120.3 kg)   SpO2 96%   BMI 36.98 kg/m  General: Well developed, NAD, BMI noted Neck: No  thyromegaly  HEENT:  Normocephalic . Face symmetric, atraumatic Lungs:  CTA B Normal respiratory effort, no intercostal retractions, no accessory muscle use. Heart: Bradycardic, no murmur Abdomen:  Not distended, soft, non-tender. No rebound or rigidity.   Lower extremities: no pretibial edema bilaterally  Skin: Right great toenail obviously dystrophic Neurologic:  alert & oriented X3.  Speech normal, gait appropriate for age and unassisted  Strength symmetric and appropriate for age.  Psych: Cognition and judgment appear intact.  Cooperative with normal attention span and concentration.  Behavior appropriate. No anxious or depressed appearing.     Assessment    ASSESSMENT Dislipidemia:  Dx w/ OSA remotely , used a CPAP for short time, no CPAP as of 2020.  Using a wedge pillow Obesity + FH CAD, mother age 18 +FH colon ca: brother and sister DJD  PLAN Here for CPX Dyslipidemia: Has declined statins recently, he understands the benefits Dystrophic right great toe nail: Previously treated successfully with oral antifungals, dystrophy return, request Jublia, rx sent, how to use it discussed. Obesity: Describes a healthy lifestyle. BP slightly elevated today,  recommend to check at home, see AVS. RTC 1 year  This visit occurred during the SARS-CoV-2 public health emergency.  Safety protocols were in place, including screening questions prior to the visit, additional usage of staff PPE, and extensive cleaning of exam room while observing appropriate contact time as indicated for disinfecting solutions.

## 2020-06-23 NOTE — Telephone Encounter (Signed)
Rx sent 

## 2020-06-23 NOTE — Patient Instructions (Addendum)
start aspirin 81 mg every day with food   Check the  blood pressure 2  times a month   Be sure your blood pressure is between 110/65 and  145/85.  if it is consistently higher or lower, let me know  To find a good quality blood pressure cuff:  [email or web address]    Apply Jublia daily to your toe. Follow these instructions: nail should be clean and dry. Wait at least 10 minutes after showering, bathing, or washing the area prior to application. Gently spread the solution completely around the toenail (toenail bed, folds, hyponychium, undersurface of the toenail plate) with the attached applicator brush and let dry thoroughly; wash hands with soap and water after use.   GO TO THE LAB : Get the blood work     GO TO THE FRONT DESK, PLEASE SCHEDULE YOUR APPOINTMENTS Come back for   a physical exam in 1 year         Advance Directive  Advance directives are legal documents that allow you to make decisions about your health care and medical treatment in case you become unable to communicate for yourself. Advance directives let your wishes be known to family, friends, and health care providers. Discussing and writing advance directives should happen over time rather than all at once. Advance directives can be changed and updated at any time. There are different types of advance directives, such as:  Medical power of attorney.  Living will.  Do not resuscitate (DNR) order or do not attempt resuscitation (DNAR) order. Health care proxy and medical power of attorney A health care proxy is also called a health care agent. This person is appointed to make medical decisions for you when you are unable to make decisions for yourself. Generally, people ask a trusted friend or family member to act as their proxy and represent their preferences. Make sure you have an agreement with your trusted person to act as your proxy. A proxy may have to make a medical decision on your behalf if your wishes  are not known. A medical power of attorney, also called a durable power of attorney for health care, is a legal document that names your health care proxy. Depending on the laws in your state, the document may need to be:  Signed.  Notarized.  Dated.  Copied.  Witnessed.  Incorporated into your medical record. You may also want to appoint a trusted person to manage your money in the event you are unable to do so. This is called a durable power of attorney for finances. It is a separate legal document from the durable power of attorney for health care. You may choose your health care proxy or someone different to act as your agent in money matters. If you do not appoint a proxy, or there is a concern that the proxy is not acting in your best interest, a court may appoint a guardian to act on your behalf. Living will A living will is a set of instructions that state your wishes about medical care when you cannot express them yourself. Health care providers should keep a copy of your living will in your medical record. You may want to give a copy to family members or friends. To alert caregivers in case of an emergency, you can place a card in your wallet to let them know that you have a living will and where they can find it. A living will is used if you become:  Terminally ill.  Disabled.  Unable to communicate or make decisions. The following decisions should be included in your living will:  To use or not to use life support equipment, such as dialysis machines and breathing machines (ventilators).  Whether you want a DNR or DNAR order. This tells health care providers not to use cardiopulmonary resuscitation (CPR) if breathing or heartbeat stops.  To use or not to use tube feeding.  To be given or not to be given food and fluids.  Whether you want comfort (palliative) care when the goal becomes comfort rather than a cure.  Whether you want to donate your organs and tissues. A  living will does not give instructions for distributing your money and property if you should pass away. DNR or DNAR A DNR or DNAR order is a request not to have CPR in the event that your heart stops beating or you stop breathing. If a DNR or DNAR order has not been made and shared, a health care provider will try to help any patient whose heart has stopped or who has stopped breathing. If you plan to have surgery, talk with your health care provider about how your DNR or DNAR order will be followed if problems occur. What if I do not have an advance directive? Some states assign family decision makers to act on your behalf if you do not have an advance directive. Each state has its own laws about advance directives. You may want to check with your health care provider, attorney, or state representative about the laws in your state. Summary  Advance directives are legal documents that allow you to make decisions about your health care and medical treatment in case you become unable to communicate for yourself.  The process of discussing and writing advance directives should happen over time. You can change and update advance directives at any time.  Advance directives may include a medical power of attorney, a living will, and a DNR or DNAR order. This information is not intended to replace advice given to you by your health care provider. Make sure you discuss any questions you have with your health care provider. Document Revised: 01/18/2020 Document Reviewed: 01/18/2020 Elsevier Patient Education  2021 [employer].

## 2020-06-25 ENCOUNTER — Encounter: Payer: Self-pay | Admitting: Internal Medicine

## 2020-06-25 NOTE — Assessment & Plan Note (Signed)
-   Td:09/2017 - PNM 23: 2020; PNM 23: 10-2019 -covid vaccine x3 - shingrix completed -flu shot today - CCS: ++ FH; cscope 1998, 2006; had 2 additional cscopes per pt ; last 10/23/2018, Dr Flo Shanks, 1 polyp, hyperplastic, see Care Everywhere, next per GI --+ FH CAD: declined statins so far, knows benefits, rec ASA 81   --prostate ca screening: DRE wnl 2021, recheck PSA --Diet and exercise: Reportedly doing well --Labs: BMP, FLP, A1c, PSA

## 2020-06-25 NOTE — Assessment & Plan Note (Signed)
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 Obesity: Describes a healthy lifestyle. BP slightly elevated today, recommend to check at home, see AVS. RTC 1 year

## 2020-07-12 ENCOUNTER — Encounter: Payer: Self-pay | Admitting: Internal Medicine

## 2020-07-14 ENCOUNTER — Encounter: Payer: Self-pay | Admitting: Internal Medicine

## 2020-07-14 DIAGNOSIS — E785 Hyperlipidemia, unspecified: Secondary | ICD-10-CM

## 2020-07-17 MED ORDER — ATORVASTATIN CALCIUM 20 MG PO TABS: 20.0000 mg | ORAL_TABLET | Freq: Every day | ORAL | 3 refills | Status: DC

## 2020-07-18 NOTE — Telephone Encounter (Signed)
lvm to schedule follow up appt

## 2020-10-06 ENCOUNTER — Telehealth: Payer: Self-pay

## 2020-10-06 ENCOUNTER — Ambulatory Visit: Payer: BC Managed Care – PPO | Admitting: Internal Medicine

## 2020-10-06 NOTE — Telephone Encounter (Signed)
Jennifer aware.  

## 2020-10-06 NOTE — Telephone Encounter (Signed)
Noted, thank you No charge 

## 2020-10-06 NOTE — Telephone Encounter (Signed)
FYI

## 2020-10-06 NOTE — Telephone Encounter (Signed)
Pt called @ 8:26 am stating he received his reminder yesterday for today's appt and he apologized that he cannot make the appt today because he is currently without insurance.  He will call us back to reschedule once this is resolved.

## 2020-10-22 ENCOUNTER — Other Ambulatory Visit: Payer: Self-pay | Admitting: Internal Medicine

## 2020-10-23 NOTE — Telephone Encounter (Signed)
Pt lost insurance however, per PCP unable to refill because he was not able to come back to check his liver functions.

## 2021-02-28 ENCOUNTER — Encounter: Payer: Self-pay | Admitting: Internal Medicine

## 2021-03-01 ENCOUNTER — Other Ambulatory Visit: Payer: Self-pay | Admitting: Internal Medicine

## 2021-03-01 MED ORDER — SCOPOLAMINE 1 MG/3DAYS TD PT72: 1.0000 | MEDICATED_PATCH | TRANSDERMAL | 0 refills | Status: DC

## 2021-03-06 ENCOUNTER — Ambulatory Visit (INDEPENDENT_AMBULATORY_CARE_PROVIDER_SITE_OTHER): Payer: BC Managed Care – PPO | Admitting: Internal Medicine

## 2021-03-06 ENCOUNTER — Other Ambulatory Visit: Payer: Self-pay | Admitting: *Deleted

## 2021-03-06 ENCOUNTER — Other Ambulatory Visit: Payer: Self-pay

## 2021-03-06 ENCOUNTER — Encounter: Payer: Self-pay | Admitting: Internal Medicine

## 2021-03-06 VITALS — BP 135/79 | HR 50 | Temp 97.9°F | Resp 12 | Ht 71.0 in | Wt 266.2 lb

## 2021-03-06 DIAGNOSIS — E785 Hyperlipidemia, unspecified: Secondary | ICD-10-CM

## 2021-03-06 DIAGNOSIS — R001 Bradycardia, unspecified: Secondary | ICD-10-CM | POA: Diagnosis not present

## 2021-03-06 LAB — TSH: TSH: 1.29 u[IU]/mL (ref 0.35–5.50)

## 2021-03-06 LAB — LIPID PANEL
Cholesterol: 161 mg/dL (ref 0–200)
HDL: 41.3 mg/dL (ref >39.00)
LDL Cholesterol: 89 mg/dL (ref 0–99)
NonHDL: 120.17
Total CHOL/HDL Ratio: 4
Triglycerides: 158 mg/dL — ABNORMAL HIGH (ref 0.0–149.0)
VLDL: 31.6 mg/dL (ref 0.0–40.0)

## 2021-03-06 LAB — ALT: ALT: 20 U/L (ref 0–53)

## 2021-03-06 LAB — AST: AST: 17 U/L (ref 0–37)

## 2021-03-06 MED ORDER — ATORVASTATIN CALCIUM 20 MG PO TABS: 20.0000 mg | ORAL_TABLET | Freq: Every day | ORAL | 1 refills | Status: DC

## 2021-03-06 NOTE — Patient Instructions (Addendum)
   GO TO THE LAB : Get the blood work     GO TO THE FRONT DESK, PLEASE SCHEDULE YOUR APPOINTMENTS Come back for   a physical exam by 05-2021

## 2021-03-06 NOTE — Progress Notes (Signed)
Subjective:    Patient ID: Jermaine Johnson, male    DOB: [birth year], 65 y.o.   MRN: [id number]  DOS:  03/06/2021 Type of visit - description:  f/u  Started to take atorvastatin, due for labs.  Was noted to be bradycardic.  This is not the first time he is bradycardic. Denies any dizziness, chest pain, difficulty breathing. Exercise tolerance is normal. Today is slightly "lethargic" but otherwise feels well.   Wt Readings from Last 3 Encounters:  03/06/21 266 lb 3.2 oz (120.7 kg)  06/23/20 265 lb 2 oz (120.3 kg)  11/08/19 266 lb 6 oz (120.8 kg)     Review of Systems See above   Past Medical History:  Diagnosis Date   Allergy    CALCANEAL SPUR, RIGHT 03/20/2010   History of colon polyps    Migraine    ONYCHOMYCOSIS 11/12/2007   ROTATOR CUFF INJURY, LEFT SHOULDER 07/29/2007    Past Surgical History:  Procedure Laterality Date   APPENDECTOMY     HERNIA REPAIR     inguinal-not sure of side   TONSILLECTOMY AND ADENOIDECTOMY  1952   VASECTOMY      Allergies as of 03/06/2021   No Known Allergies      Medication List        Accurate as of March 06, 2021  7:27 PM. If you have any questions, ask your nurse or doctor.          albuterol 108 (90 Base) MCG/ACT inhaler Commonly known as: VENTOLIN HFA Inhale 2 puffs into the lungs every 6 (six) hours as needed for wheezing or shortness of breath.   atorvastatin 20 MG tablet Commonly known as: LIPITOR Take 1 tablet (20 mg total) by mouth at bedtime.   ciclopirox 8 % solution Commonly known as: PENLAC Apply topically at bedtime. Apply over nail and surrounding skin. Apply daily over previous coat. After seven (7) days, may remove with alcohol and continue cycle. Use for daily for 1 year.   ibuprofen 200 MG tablet Commonly known as: ADVIL Take 200 mg by mouth every 6 (six) hours as needed.   naproxen 250 MG tablet Commonly known as: NAPROSYN Take 250 mg by mouth 2 (two) times daily with a meal.   Omega-3 Krill  Oil 300 MG Caps Take 1 capsule (300 mg total) by mouth 2 (two) times daily.   scopolamine 1 MG/3DAYS Commonly known as: TRANSDERM-SCOP Place 1 patch (1.5 mg total) onto the skin every 3 (three) days.           Objective:   Physical Exam BP 135/79 (BP Location: Right Arm, Cuff Size: Large)   Pulse (!) 50   Temp 97.9 F (36.6 C) (Oral)   Resp 12   Ht 5\' 11"  (1.803 m)   Wt 266 lb 3.2 oz (120.7 kg)   SpO2 99%   BMI 37.13 kg/m  General:   Well developed, NAD, BMI noted. HEENT:  Normocephalic . Face symmetric, atraumatic Lungs:  CTA B Normal respiratory effort, no intercostal retractions, no accessory muscle use. Heart: Bradycardic,   heart rate: 44 Lower extremities: no pretibial edema bilaterally  Skin: Not pale. Not jaundice Neurologic:  alert & oriented X3.  Speech normal, gait appropriate for age and unassisted Psych--  Cognition and judgment appear intact.  Cooperative with normal attention span and concentration.  Behavior appropriate. No anxious or depressed appearing.      Assessment     ASSESSMENT Dislipidemia:  Dx w/ OSA remotely , used  a CPAP for short time, no CPAP as of 2020.  Using a wedge pillow Obesity + FH CAD, mother age 20 +FH colon ca: brother and sister DJD  PLAN Dyslipidemia: Since the last visit, started atorvastatin, good compliance until he ran out a couple of days ago.  We will check a FLP, AST, ALT.  RF atorvastatin. Memory: Patient's wife is somewhat concerned about memory issues, pt describes  forgetfulness but no dementia sxs.  Rec observation. Bradycardia: Noted again on physical exam.  Essentially no symptoms.   EKG today: Sinus bradycardia, some PACs.  Check TSH. RTC 05/2021 CPX.    This visit occurred during the SARS-CoV-2 public health emergency.  Safety protocols were in place, including screening questions prior to the visit, additional usage of staff PPE, and extensive cleaning of exam room while observing appropriate  contact time as indicated for disinfecting solutions.

## 2021-03-06 NOTE — Assessment & Plan Note (Signed)
Dyslipidemia: Since the last visit, started atorvastatin, good compliance until he ran out a couple of days ago.  We will check a FLP, AST, ALT.  RF atorvastatin. Memory: Patient's wife is somewhat concerned about memory issues, pt describes  forgetfulness but no dementia sxs.  Rec observation. Bradycardia: Noted again on physical exam.  Essentially no symptoms.   EKG today: Sinus bradycardia, some PACs.  Check TSH. RTC 05/2021 CPX.

## 2021-05-24 IMAGING — DX DG CHEST 1V PORT
1 series · 1 of 1 positions shown · non-contrast
Comparison: None.

CLINICAL DATA: Cough

EXAM:
PORTABLE CHEST 1 VIEW

[chest ap]
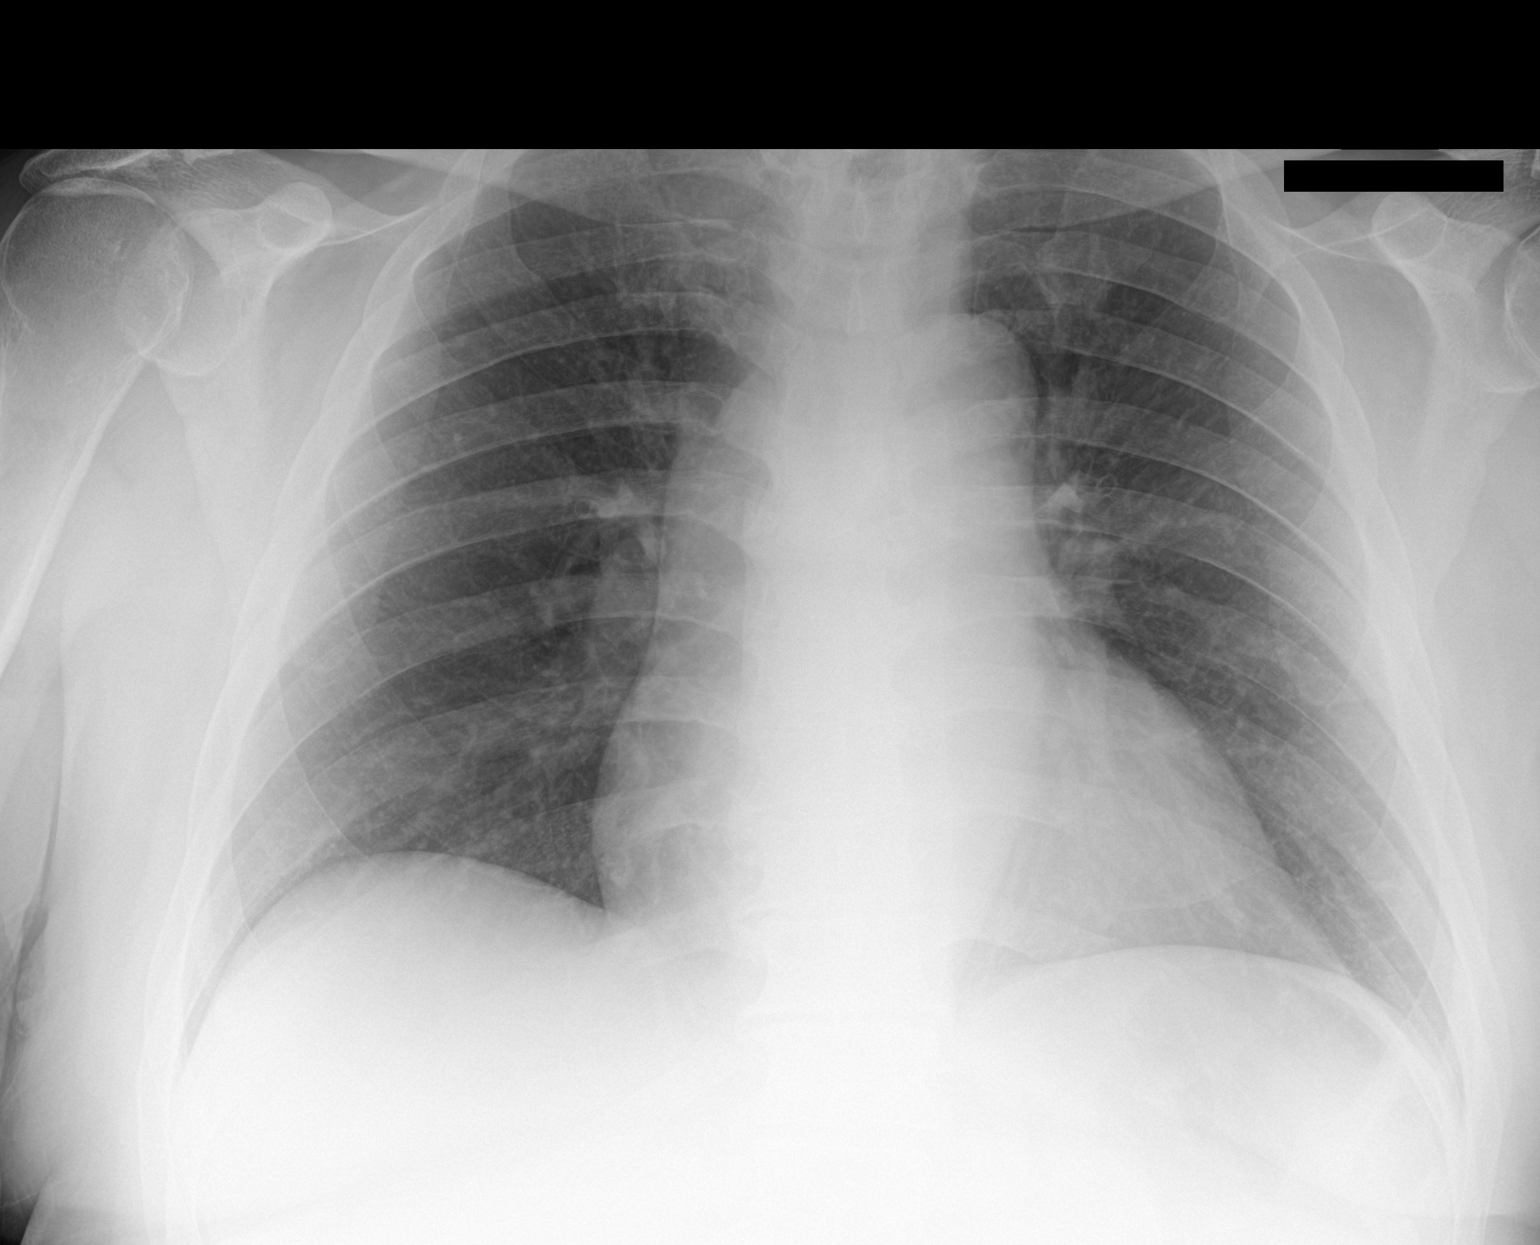

[1 of 1 positions shown; findings below may reference images not displayed]

FINDINGS: Cardiac silhouette is borderline enlarged and accentuated by low
lung volumes and portable technique. No consolidation. No visible
pleural effusions or pneumothorax. The visualized skeletal
structures are unremarkable.
IMPRESSION: 1. No acute cardiopulmonary disease.
2. Borderline cardiomegaly.

## 2021-06-29 ENCOUNTER — Encounter: Payer: Medicare HMO | Admitting: Internal Medicine

## 2021-07-02 ENCOUNTER — Encounter: Payer: Self-pay | Admitting: Internal Medicine

## 2021-07-02 ENCOUNTER — Ambulatory Visit (INDEPENDENT_AMBULATORY_CARE_PROVIDER_SITE_OTHER): Payer: Medicare HMO | Admitting: Internal Medicine

## 2021-07-02 VITALS — BP 126/82 | HR 55 | Temp 97.8°F | Resp 16 | Ht 71.0 in | Wt 264.4 lb

## 2021-07-02 DIAGNOSIS — I491 Atrial premature depolarization: Secondary | ICD-10-CM | POA: Diagnosis not present

## 2021-07-02 DIAGNOSIS — Z0000 Encounter for general adult medical examination without abnormal findings: Secondary | ICD-10-CM | POA: Diagnosis not present

## 2021-07-02 DIAGNOSIS — E785 Hyperlipidemia, unspecified: Secondary | ICD-10-CM

## 2021-07-02 LAB — CBC WITH DIFFERENTIAL/PLATELET
Basophils Absolute: 0 10*3/uL (ref 0.0–0.1)
Basophils Relative: 0.5 % (ref 0.0–3.0)
Eosinophils Absolute: 0.1 10*3/uL (ref 0.0–0.7)
Eosinophils Relative: 2.4 % (ref 0.0–5.0)
HCT: 41.4 % (ref 39.0–52.0)
Hemoglobin: 14.2 g/dL (ref 13.0–17.0)
Lymphocytes Relative: 32.7 % (ref 12.0–46.0)
Lymphs Abs: 1.9 10*3/uL (ref 0.7–4.0)
MCHC: 34.4 g/dL (ref 30.0–36.0)
MCV: 85.5 fl (ref 78.0–100.0)
Monocytes Absolute: 0.4 10*3/uL (ref 0.1–1.0)
Monocytes Relative: 6.7 % (ref 3.0–12.0)
Neutro Abs: 3.4 10*3/uL (ref 1.4–7.7)
Neutrophils Relative %: 57.7 % (ref 43.0–77.0)
Platelets: 165 10*3/uL (ref 150.0–400.0)
RBC: 4.84 Mil/uL (ref 4.22–5.81)
RDW: 13.5 % (ref 11.5–15.5)
WBC: 5.9 10*3/uL (ref 4.0–10.5)

## 2021-07-02 LAB — PSA: PSA: 3.75 ng/mL (ref 0.10–4.00)

## 2021-07-02 LAB — BASIC METABOLIC PANEL
BUN: 19 mg/dL (ref 6–23)
CO2: 29 mEq/L (ref 19–32)
Calcium: 9.3 mg/dL (ref 8.4–10.5)
Chloride: 102 mEq/L (ref 96–112)
Creatinine, Ser: 1.06 mg/dL (ref 0.40–1.50)
GFR: 72.51 mL/min (ref >60.00)
Glucose, Bld: 96 mg/dL (ref 70–99)
Potassium: 4.6 mEq/L (ref 3.5–5.1)
Sodium: 138 mEq/L (ref 135–145)

## 2021-07-02 LAB — MAGNESIUM: Magnesium: 2 mg/dL (ref 1.5–2.5)

## 2021-07-02 NOTE — Patient Instructions (Addendum)
No symptoms. ? ? ?GO TO THE LAB : Get the blood work   ? ? ?GO TO THE FRONT DESK, PLEASE SCHEDULE YOUR APPOINTMENTS ?Come back for a checkup in 6 months ? ?"Living will", "Health Care Power of attorney": Advanced care planning ? ?(If you already have a living will or healthcare power of attorney, please bring the copy to be scanned in your chart.) ? ?Advance care planning is a process that supports adults in  understanding and sharing their preferences regarding future medical care.  ? ?The patient's preferences are recorded in documents called Advance Directives.    ?Advanced directives are completed (and can be modified at any time) while the patient is in full mental capacity.  ? ?The documentation should be available at all times to the patient, the family and the healthcare providers.  ?Bring in a copy to be scanned in your chart is an excellent idea and is recommended  ? ?This legal documents direct treatment decision making and/or appoint a surrogate to make the decision if the patient is not capable to do so.  ? ? ?Advance directives can be documented in many types of formats,  documents have names such as:  ?Lliving will  ?Durable power of attorney for healthcare (healthcare proxy or healthcare power of attorney)  ?Combined directives  ?Physician orders for life-sustaining treatment  ?  ?More information at: ? ?[email or web address]  ?

## 2021-07-02 NOTE — Progress Notes (Signed)
? ?Subjective:  ? ? Patient ID: Jermaine Johnson, male    DOB: 27-Oct-1955, 66 y.o.   MRN: [id number] ? ?DOS:  07/02/2021 ?Type of visit - description: cpx ? ?Here for CPX, feels well.  Has no major concerns. ?Denies chest pain, palpitations. ?Occasionally has nocturia, no dysuria or gross hematuria. ?Energy level is good. ? ?Wt Readings from Last 3 Encounters:  ?07/02/21 264 lb 6 oz (119.9 kg)  ?03/06/21 266 lb 3.2 oz (120.7 kg)  ?06/23/20 265 lb 2 oz (120.3 kg)  ? ?Review of Systems ? ?Other than above, a 14 point review of systems is negative  ?  ? ? ?Past Medical History:  ?Diagnosis Date  ? Allergy   ? CALCANEAL SPUR, RIGHT 03/20/2010  ? History of colon polyps   ? Migraine   ? ONYCHOMYCOSIS 11/12/2007  ? ROTATOR CUFF INJURY, LEFT SHOULDER 07/29/2007  ? ? ?Past Surgical History:  ?Procedure Laterality Date  ? APPENDECTOMY    ? HERNIA REPAIR    ? inguinal-not sure of side  ? [place]  ? VASECTOMY    ? ?Social History  ? ?Socioeconomic History  ? Marital status: Married  ?  Spouse name: Not on file  ? Number of children: 2  ? Years of education: Not on file  ? Highest education level: Not on file  ?Occupational History  ? Occupation: retired-IT   ?Tobacco Use  ? Smoking status: Never  ? Smokeless tobacco: Never  ? Tobacco comments:  ?   No tobacco   ?Substance and Sexual Activity  ? Alcohol use: Yes  ?  Alcohol/week: 4.0 standard drinks  ?  Types: 4 Glasses of wine per week  ?  Comment: about 4 beers a week now instead of wine  ? Drug use: Yes  ?  Types: Cocaine  ? Sexual activity: Yes  ?Other Topics Concern  ? Not on file  ?Social History Narrative  ? Married (wife Zakry Bonebrake ); household includes his daughter   ? 2 children-michael  katie     ? Retired  Production designer, theatre/television/film, networking  ? Hobbies: build stuff, sail, artist-painting, drawing, sculpture, pencil.   ? ?Social Determinants of Health  ? ?Financial Resource Strain: Not on file  ?Food Insecurity: Not on file  ?Transportation Needs: Not on file   ?Physical Activity: Not on file  ?Stress: Not on file  ?Social Connections: Not on file  ?Intimate Partner Violence: Not on file  ? ? ?Current Outpatient Medications  ?Medication Instructions  ? albuterol (VENTOLIN HFA) 108 (90 Base) MCG/ACT inhaler 2 puffs, Inhalation, Every 6 hours PRN  ? atorvastatin (LIPITOR) 20 mg, Oral, Daily at bedtime  ? ciclopirox (PENLAC) 8 % solution Topical, Daily at bedtime, Apply over nail and surrounding skin. Apply daily over previous coat. After seven (7) days, may remove with alcohol and continue cycle. Use for daily for 1 year.  ? ibuprofen (ADVIL) 200 mg, Oral, Every 6 hours PRN,    ? naproxen (NAPROSYN) 250 mg, Oral, 2 times daily with meals  ? Omega-3 Krill Oil 300 mg, Oral, 2 times daily  ? ? ?   ?Objective:  ? Physical Exam ?BP 126/82 (BP Location: Left Arm, Patient Position: Sitting, Cuff Size: Normal)   Pulse (!) 55   Temp 97.8 ?F (36.6 ?C) (Oral)   Resp 16   Ht 5\' 11"  (1.803 m)   Wt 264 lb 6 oz (119.9 kg)   SpO2 98%   BMI 36.87 kg/m?  ?  General: ?Well developed, NAD, BMI noted ?Neck: No  thyromegaly  ?HEENT:  ?Normocephalic . Face symmetric, atraumatic ?Lungs:  ?CTA B ?Normal respiratory effort, no intercostal retractions, no accessory muscle use. ?Heart: Frequent irregular beats ?Abdomen:  ?Not distended, soft, non-tender. No rebound or rigidity.   ?Lower extremities: no pretibial edema bilaterally ?DRE: Normal sphincter tone, no stools.  Prostate normal ?Skin: Exposed areas without rash. Not pale. Not jaundice ?Neurologic:  ?alert & oriented X3.  ?Speech normal, gait appropriate for age and unassisted ?Strength symmetric and appropriate for age.  ?Psych: ?Cognition and judgment appear intact.  ?Cooperative with normal attention span and concentration.  ?Behavior appropriate. ?No anxious or depressed appearing. ? ?   ?Assessment   ? ?ASSESSMENT ?Dislipidemia:  ?Dx w/ OSA remotely , used a CPAP for short time, no CPAP as of 2020.  Using a wedge pillow ?Obesity ?+ FH  CAD, mother age 19 ?+FH colon ca: brother and sister ?DJD ? ?PLAN ?Here for CPX ?Dyslipidemia: Started Lipitor, f/u FLP excellent. ?History of OSA, dx remotely, no cpap, currently w/ no fatigue ?Frequent PACs: EKG today with no acute changes. No sxs. Back in 2020 declined further eval, ?Obesity: Extensive counseling. ?NSAIDs: Use them sparingly. ?RTC 6 months ?  ?This visit occurred during the SARS-CoV-2 public health emergency.  Safety protocols were in place, including screening questions prior to the visit, additional usage of staff PPE, and extensive cleaning of exam room while observing appropriate contact time as indicated for disinfecting solutions.  ? ?

## 2021-07-03 ENCOUNTER — Encounter: Payer: Self-pay | Admitting: Internal Medicine

## 2021-07-03 NOTE — Assessment & Plan Note (Signed)
-   Td: 09/2017 ?- PNM 23: 2020; PNM 23: 10-2019 ?-covid vax: utd ?- shingrix completed ?- flu shot: utd  ?- CCS: ++ FH;  cscope 1998, 2006; had 2 additional cscopes per pt ; last 10/23/2018, Dr Fausto Skillern, 1 polyp, hyperplastic, see Care Everywhere, next per GI ?--+ FH  CAD: on lipitor and ASA 81   ?--prostate ca screening: DRE wnl today, check PSA ?--Diet and exercise: Extensive counseling ?--Labs:  BMP CBC PSA magnesium ?-ACP information provided ?

## 2021-07-03 NOTE — Assessment & Plan Note (Signed)
Here for CPX ?Dyslipidemia: Started Lipitor, f/u FLP excellent. ?History of OSA, dx remotely, no cpap, currently w/ no fatigue ?Frequent PACs: EKG today with no acute changes. No sxs. Back in 2020 declined further eval, ?Obesity: Extensive counseling. ?NSAIDs: Use them sparingly. ?RTC 6 months ?

## 2021-09-03 ENCOUNTER — Other Ambulatory Visit: Payer: Self-pay | Admitting: Internal Medicine

## 2021-11-01 ENCOUNTER — Encounter: Payer: Self-pay | Admitting: Internal Medicine

## 2021-12-10 ENCOUNTER — Telehealth: Payer: Self-pay | Admitting: Internal Medicine

## 2021-12-10 NOTE — Telephone Encounter (Signed)
Pt is calling and would like to transfer to Jermaine Johnson due to [place] is closer

## 2021-12-10 NOTE — Telephone Encounter (Signed)
Yes from my side. Thank you

## 2021-12-13 NOTE — Telephone Encounter (Signed)
Left message to return phone call.

## 2021-12-18 ENCOUNTER — Ambulatory Visit (INDEPENDENT_AMBULATORY_CARE_PROVIDER_SITE_OTHER): Payer: Medicare HMO | Admitting: Adult Health

## 2021-12-18 ENCOUNTER — Encounter: Payer: Self-pay | Admitting: Adult Health

## 2021-12-18 VITALS — BP 130/78 | HR 47 | Temp 98.4°F | Ht 69.0 in | Wt 265.0 lb

## 2021-12-18 DIAGNOSIS — Z7689 Persons encountering health services in other specified circumstances: Secondary | ICD-10-CM | POA: Diagnosis not present

## 2021-12-18 DIAGNOSIS — E785 Hyperlipidemia, unspecified: Secondary | ICD-10-CM

## 2021-12-18 NOTE — Progress Notes (Signed)
Patient presents to clinic today to establish care. He is a 66 year old male who  has a past medical history of Allergy, CALCANEAL SPUR, RIGHT (03/20/2010), History of colon polyps, Migraine, ONYCHOMYCOSIS (11/12/2007), and ROTATOR CUFF INJURY, LEFT SHOULDER (07/29/2007).  He is transferring from Dr. Drue Novel as this office is closer. His last CPE was done in 06/2021   Acute Concerns: Establish Care  Chronic Issues: Hyperlipidemia - takes Lipitor 20 mg daily. He denies myalgia or fatigue Lab Results  Component Value Date   CHOL 161 03/06/2021   HDL 41.30 03/06/2021   LDLCALC 89 03/06/2021   LDLDIRECT 99.2 01/18/2013   TRIG 158.0 (H) 03/06/2021   CHOLHDL 4 03/06/2021   Health Maintenance: Dental -- He does do routine care Vision -- He does do routine care Immunizations --  UTD Colonoscopy -- He is up to date on his colon cancer screening. He gets them every 5 years due to a brother and sister with colon cancer.  Diet: Tries to eat healthy  Exercise: He is not exercising on a regular basis but stays active with outdoor activity  Wt Readings from Last 3 Encounters:  12/18/21 265 lb (120.2 kg)  07/02/21 264 lb 6 oz (119.9 kg)  03/06/21 266 lb 3.2 oz (120.7 kg)    Past Medical History:  Diagnosis Date   Allergy    CALCANEAL SPUR, RIGHT 03/20/2010   History of colon polyps    Migraine    ONYCHOMYCOSIS 11/12/2007   ROTATOR CUFF INJURY, LEFT SHOULDER 07/29/2007    Past Surgical History:  Procedure Laterality Date   APPENDECTOMY     HERNIA REPAIR     inguinal-not sure of side   TONSILLECTOMY AND ADENOIDECTOMY  1952   VASECTOMY      Current Outpatient Medications on File Prior to Visit  Medication Sig Dispense Refill   atorvastatin (LIPITOR) 20 MG tablet TAKE 1 TABLET BY MOUTH EVERYDAY AT BEDTIME 90 tablet 1   ciclopirox (PENLAC) 8 % solution Apply topically at bedtime. Apply over nail and surrounding skin. Apply daily over previous coat. After seven (7) days, may remove with  alcohol and continue cycle. Use for daily for 1 year. 6.6 mL 11   ibuprofen (ADVIL,MOTRIN) 200 MG tablet Take 200 mg by mouth every 6 (six) hours as needed.     naproxen (NAPROSYN) 250 MG tablet Take 250 mg by mouth 2 (two) times daily with a meal.     Omega-3 Krill Oil 300 MG CAPS Take 1 capsule (300 mg total) by mouth 2 (two) times daily.     No current facility-administered medications on file prior to visit.    No Known Allergies  Family History  Problem Relation Age of Onset   Heart disease Mother        CABG x2 at 72. Surgery for "enlarged heart" later   Hyperlipidemia Mother    Diabetes Father    Alzheimer's disease Father    Colon cancer Brother        in 38s   Colon cancer Sister        8   ALS Brother        other brother   Prostate cancer Neg Hx     Social History   Socioeconomic History   Marital status: Married    Spouse name: Not on file   Number of children: 2   Years of education: Not on file   Highest education level: Not on file  Occupational History  Occupation: retired-IT   Tobacco Use   Smoking status: Never   Smokeless tobacco: Never   Tobacco comments:     No tobacco   Substance and Sexual Activity   Alcohol use: Not Currently    Comment: socially   Drug use: Never   Sexual activity: Yes  Other Topics Concern   Not on file  Social History Narrative   Married (wife Codee Tutson ); household includes his daughter    2 children-michael  katie      Retired  Consulting civil engineer support, networking   Hobbies: build stuff, sail, artist-painting, drawing, sculpture, pencil.    Social Determinants of Health   Financial Resource Strain: Not on file  Food Insecurity: Not on file  Transportation Needs: Not on file  Physical Activity: Not on file  Stress: Not on file  Social Connections: Not on file  Intimate Partner Violence: Not on file    Review of Systems  Constitutional: Negative.   HENT: Negative.    Eyes: Negative.   Respiratory: Negative.     Cardiovascular: Negative.   Gastrointestinal: Negative.   Genitourinary: Negative.   Musculoskeletal: Negative.   Skin: Negative.   All other systems reviewed and are negative.   BP 130/78   Pulse (!) 47   Temp 98.4 F (36.9 C) (Oral)   Ht 5\' 9"  (1.753 m)   Wt 265 lb (120.2 kg)   SpO2 98%   BMI 39.13 kg/m   Physical Exam Vitals and nursing note reviewed.  Constitutional:      General: He is not in acute distress.    Appearance: Normal appearance. He is well-developed.  HENT:     Head: Normocephalic and atraumatic.     Right Ear: Tympanic membrane, ear canal and external ear normal. There is no impacted cerumen.     Left Ear: Tympanic membrane, ear canal and external ear normal. There is no impacted cerumen.     Nose: Nose normal. No congestion or rhinorrhea.     Mouth/Throat:     Mouth: Mucous membranes are moist.     Pharynx: Oropharynx is clear. No oropharyngeal exudate or posterior oropharyngeal erythema.  Eyes:     General:        Right eye: No discharge.        Left eye: No discharge.     Extraocular Movements: Extraocular movements intact.     Conjunctiva/sclera: Conjunctivae normal.     Pupils: Pupils are equal, round, and reactive to light.  Neck:     Vascular: No carotid bruit.     Trachea: No tracheal deviation.  Cardiovascular:     Rate and Rhythm: Normal rate and regular rhythm.     Pulses: Normal pulses.     Heart sounds: Normal heart sounds. No murmur heard.    No friction rub. No gallop.  Pulmonary:     Effort: Pulmonary effort is normal. No respiratory distress.     Breath sounds: Normal breath sounds. No stridor. No wheezing, rhonchi or rales.  Chest:     Chest wall: No tenderness.  Abdominal:     General: Bowel sounds are normal. There is no distension.     Palpations: Abdomen is soft. There is no mass.     Tenderness: There is no abdominal tenderness. There is no right CVA tenderness, left CVA tenderness, guarding or rebound.     Hernia: No  hernia is present.  Musculoskeletal:        General: No swelling, tenderness, deformity or signs  of injury. Normal range of motion.     Right lower leg: No edema.     Left lower leg: No edema.  Lymphadenopathy:     Cervical: No cervical adenopathy.  Skin:    General: Skin is warm and dry.     Capillary Refill: Capillary refill takes less than 2 seconds.     Coloration: Skin is not jaundiced or pale.     Findings: No bruising, erythema, lesion or rash.  Neurological:     General: No focal deficit present.     Mental Status: He is alert and oriented to person, place, and time.     Cranial Nerves: No cranial nerve deficit.     Sensory: No sensory deficit.     Motor: No weakness.     Coordination: Coordination normal.     Gait: Gait normal.     Deep Tendon Reflexes: Reflexes normal.  Psychiatric:        Mood and Affect: Mood normal.        Behavior: Behavior normal.        Thought Content: Thought content normal.        Judgment: Judgment normal.    Assessment/Plan: 1. Encounter to establish care - Follow up in March 2024 for CPE  - Continue to eat healthy and start exercising   2. Dyslipidemia - Continue with statin    Shirline Frees, NP

## 2021-12-18 NOTE — Patient Instructions (Addendum)
It was great meeting you today   I will see you back in March 2024 for your physical exam

## 2022-01-02 ENCOUNTER — Ambulatory Visit: Payer: Medicare HMO | Admitting: Internal Medicine

## 2022-01-03 DIAGNOSIS — H5203 Hypermetropia, bilateral: Secondary | ICD-10-CM | POA: Diagnosis not present

## 2022-01-17 ENCOUNTER — Ambulatory Visit (INDEPENDENT_AMBULATORY_CARE_PROVIDER_SITE_OTHER): Payer: Medicare HMO

## 2022-01-17 VITALS — BP 120/60 | HR 60 | Temp 97.9°F | Ht 69.0 in | Wt 266.8 lb

## 2022-01-17 DIAGNOSIS — Z0000 Encounter for general adult medical examination without abnormal findings: Secondary | ICD-10-CM

## 2022-01-17 DIAGNOSIS — Z23 Encounter for immunization: Secondary | ICD-10-CM

## 2022-01-17 NOTE — Patient Instructions (Addendum)
[name] , Thank you for taking time to come for your Medicare Wellness Visit. I appreciate your ongoing commitment to your health goals. Please review the following plan we discussed and let me know if I can assist you in the future.   These are the goals we discussed:  Goals       Lose weight (pt-stated)      Goal weight 250 lbs.      Quit smoking / using tobacco        This is a list of the screening recommended for you and due dates:  Health Maintenance  Topic Date Due   COVID-19 Vaccine (5 - Moderna series) 02/02/2022*   Tetanus Vaccine  10/22/2027   Colon Cancer Screening  10/22/2028   Pneumonia Vaccine  Completed   Flu Shot  Completed   Hepatitis C Screening: USPSTF Recommendation to screen - Ages 18-79 yo.  Completed   Zoster (Shingles) Vaccine  Completed   HPV Vaccine  Aged Out  *Topic was postponed. The date shown is not the original due date.    Advanced directives: Advance directive discussed with you today. Even though you declined this today, please call our office should you change your mind, and we can give you the proper paperwork for you to fill out.   Conditions/risks identified: None  Next appointment: Follow up in one year for your annual wellness visit.   Preventive Care 68 Years and Older, Male  Preventive care refers to lifestyle choices and visits with your health care provider that can promote health and wellness. What does preventive care include? A yearly physical exam. This is also called an annual well check. Dental exams once or twice a year. Routine eye exams. Ask your health care provider how often you should have your eyes checked. Personal lifestyle choices, including: Daily care of your teeth and gums. Regular physical activity. Eating a healthy diet. Avoiding tobacco and drug use. Limiting alcohol use. Practicing safe sex. Taking low doses of aspirin every day. Taking vitamin and mineral supplements as recommended by your health  care provider. What happens during an annual well check? The services and screenings done by your health care provider during your annual well check will depend on your age, overall health, lifestyle risk factors, and family history of disease. Counseling  Your health care provider may ask you questions about your: Alcohol use. Tobacco use. Drug use. Emotional well-being. Home and relationship well-being. Sexual activity. Eating habits. History of falls. Memory and ability to understand (cognition). Work and work Statistician. Screening  You may have the following tests or measurements: Height, weight, and BMI. Blood pressure. Lipid and cholesterol levels. These may be checked every 5 years, or more frequently if you are over 45 years old. Skin check. Lung cancer screening. You may have this screening every year starting at age 18 if you have a 30-pack-year history of smoking and currently smoke or have quit within the past 15 years. Fecal occult blood test (FOBT) of the stool. You may have this test every year starting at age 31. Flexible sigmoidoscopy or colonoscopy. You may have a sigmoidoscopy every 5 years or a colonoscopy every 10 years starting at age 92. Prostate cancer screening. Recommendations will vary depending on your family history and other risks. Hepatitis C blood test. Hepatitis B blood test. Sexually transmitted disease (STD) testing. Diabetes screening. This is done by checking your blood sugar (glucose) after you have not eaten for a while (fasting). You may have  this done every 1-3 years. Abdominal aortic aneurysm (AAA) screening. You may need this if you are a current or former smoker. Osteoporosis. You may be screened starting at age 55 if you are at high risk. Talk with your health care provider about your test results, treatment options, and if necessary, the need for more tests. Vaccines  Your health care provider may recommend certain vaccines, such  as: Influenza vaccine. This is recommended every year. Tetanus, diphtheria, and acellular pertussis (Tdap, Td) vaccine. You may need a Td booster every 10 years. Zoster vaccine. You may need this after age 16. Pneumococcal 13-valent conjugate (PCV13) vaccine. One dose is recommended after age 66. Pneumococcal polysaccharide (PPSV23) vaccine. One dose is recommended after age 6. Talk to your health care provider about which screenings and vaccines you need and how often you need them. This information is not intended to replace advice given to you by your health care provider. Make sure you discuss any questions you have with your health care provider. Document Released: 05/12/2015 Document Revised: 01/03/2016 Document Reviewed: 02/14/2015 Elsevier Interactive Patient Education  2017 [employer] Prevention in the Home Falls can cause injuries. They can happen to people of all ages. There are many things you can do to make your home safe and to help prevent falls. What can I do on the outside of my home? Regularly fix the edges of walkways and driveways and fix any cracks. Remove anything that might make you trip as you walk through a door, such as a raised step or threshold. Trim any bushes or trees on the path to your home. Use bright outdoor lighting. Clear any walking paths of anything that might make someone trip, such as rocks or tools. Regularly check to see if handrails are loose or broken. Make sure that both sides of any steps have handrails. Any raised decks and porches should have guardrails on the edges. Have any leaves, snow, or ice cleared regularly. Use sand or salt on walking paths during winter. Clean up any spills in your garage right away. This includes oil or grease spills. What can I do in the bathroom? Use night lights. Install grab bars by the toilet and in the tub and shower. Do not use towel bars as grab bars. Use non-skid mats or decals in the tub or  shower. If you need to sit down in the shower, use a plastic, non-slip stool. Keep the floor dry. Clean up any water that spills on the floor as soon as it happens. Remove soap buildup in the tub or shower regularly. Attach bath mats securely with double-sided non-slip rug tape. Do not have throw rugs and other things on the floor that can make you trip. What can I do in the bedroom? Use night lights. Make sure that you have a light by your bed that is easy to reach. Do not use any sheets or blankets that are too big for your bed. They should not hang down onto the floor. Have a firm chair that has side arms. You can use this for support while you get dressed. Do not have throw rugs and other things on the floor that can make you trip. What can I do in the kitchen? Clean up any spills right away. Avoid walking on wet floors. Keep items that you use a lot in easy-to-reach places. If you need to reach something above you, use a strong step stool that has a grab bar. Keep electrical cords out  of the way. Do not use floor polish or wax that makes floors slippery. If you must use wax, use non-skid floor wax. Do not have throw rugs and other things on the floor that can make you trip. What can I do with my stairs? Do not leave any items on the stairs. Make sure that there are handrails on both sides of the stairs and use them. Fix handrails that are broken or loose. Make sure that handrails are as long as the stairways. Check any carpeting to make sure that it is firmly attached to the stairs. Fix any carpet that is loose or worn. Avoid having throw rugs at the top or bottom of the stairs. If you do have throw rugs, attach them to the floor with carpet tape. Make sure that you have a light switch at the top of the stairs and the bottom of the stairs. If you do not have them, ask someone to add them for you. What else can I do to help prevent falls? Wear shoes that: Do not have high heels. Have  rubber bottoms. Are comfortable and fit you well. Are closed at the toe. Do not wear sandals. If you use a stepladder: Make sure that it is fully opened. Do not climb a closed stepladder. Make sure that both sides of the stepladder are locked into place. Ask someone to hold it for you, if possible. Clearly mark and make sure that you can see: Any grab bars or handrails. First and last steps. Where the edge of each step is. Use tools that help you move around (mobility aids) if they are needed. These include: Canes. Walkers. Scooters. Crutches. Turn on the lights when you go into a dark area. Replace any light bulbs as soon as they burn out. Set up your furniture so you have a clear path. Avoid moving your furniture around. If any of your floors are uneven, fix them. If there are any pets around you, be aware of where they are. Review your medicines with your doctor. Some medicines can make you feel dizzy. This can increase your chance of falling. Ask your doctor what other things that you can do to help prevent falls. This information is not intended to replace advice given to you by your health care provider. Make sure you discuss any questions you have with your health care provider. Document Released: 02/09/2009 Document Revised: 09/21/2015 Document Reviewed: 05/20/2014 Elsevier Interactive Patient Education  2017 [employer].

## 2022-01-17 NOTE — Progress Notes (Signed)
Subjective:   Jermaine Johnson is a 66 y.o. male who presents for Medicare Annual/Subsequent preventive examination.  Review of Systems     Cardiac Risk Factors include: advanced age (>47men, >80 women);obesity (BMI >30kg/m2);dyslipidemia;male gender     Objective:    Today's Vitals   01/17/22 1016  BP: 120/60  Pulse: 60  Temp: 97.9 F (36.6 C)  TempSrc: Oral  SpO2: 96%  Weight: 266 lb 12.8 oz (121 kg)  Height: 5\' 9"  (1.753 m)   Body mass index is 39.4 kg/m.     01/17/2022   10:28 AM  Advanced Directives  Does Patient Have a Medical Advance Directive? No  Would patient like information on creating a medical advance directive? No - Patient declined    Current Medications (verified) Outpatient Encounter Medications as of 01/17/2022  Medication Sig   atorvastatin (LIPITOR) 20 MG tablet TAKE 1 TABLET BY MOUTH EVERYDAY AT BEDTIME   ciclopirox (PENLAC) 8 % solution Apply topically at bedtime. Apply over nail and surrounding skin. Apply daily over previous coat. After seven (7) days, may remove with alcohol and continue cycle. Use for daily for 1 year.   ibuprofen (ADVIL,MOTRIN) 200 MG tablet Take 200 mg by mouth every 6 (six) hours as needed.   naproxen (NAPROSYN) 250 MG tablet Take 250 mg by mouth 2 (two) times daily with a meal.   Omega-3 Krill Oil 300 MG CAPS Take 1 capsule (300 mg total) by mouth 2 (two) times daily.   No facility-administered encounter medications on file as of 01/17/2022.    Allergies (verified) Patient has no known allergies.   History: Past Medical History:  Diagnosis Date   Allergy    CALCANEAL SPUR, RIGHT 03/20/2010   History of colon polyps    Migraine    ONYCHOMYCOSIS 11/12/2007   ROTATOR CUFF INJURY, LEFT SHOULDER 07/29/2007   Past Surgical History:  Procedure Laterality Date   APPENDECTOMY     HERNIA REPAIR     inguinal-not sure of side   TONSILLECTOMY AND ADENOIDECTOMY  1952   VASECTOMY     Family History  Problem Relation Age of  Onset   Heart disease Mother        CABG x2 at 82. Surgery for "enlarged heart" later   Hyperlipidemia Mother    Diabetes Father    Alzheimer's disease Father    Colon cancer Brother        in 30s   Colon cancer Sister        92   ALS Brother        other brother   Prostate cancer Neg Hx    Social History   Socioeconomic History   Marital status: Married    Spouse name: Not on file   Number of children: 2   Years of education: Not on file   Highest education level: Not on file  Occupational History   Occupation: retired-IT   Tobacco Use   Smoking status: Never   Smokeless tobacco: Never   Tobacco comments:     No tobacco   Substance and Sexual Activity   Alcohol use: Not Currently    Comment: socially   Drug use: Never   Sexual activity: Yes  Other Topics Concern   Not on file  Social History Narrative   Married (wife Jermaine Johnson ); household includes his daughter    2 children-michael  katie      Retired  Engineer, technical sales support, networking   Hobbies: build Art therapist, sail, artist-painting,  drawing, sculpture, pencil.    Social Determinants of Health   Financial Resource Strain: Low Risk  (01/17/2022)   Overall Financial Resource Strain (CARDIA)    Difficulty of Paying Living Expenses: Not hard at all  Food Insecurity: No Food Insecurity (01/17/2022)   Hunger Vital Sign    Worried About Running Out of Food in the Last Year: Never true    Ran Out of Food in the Last Year: Never true  Transportation Needs: No Transportation Needs (01/17/2022)   PRAPARE - Hydrologist (Medical): No    Lack of Transportation (Non-Medical): No  Physical Activity: Sufficiently Active (01/17/2022)   Exercise Vital Sign    Days of Exercise per Week: 7 days    Minutes of Exercise per Session: 50 min  Stress: No Stress Concern Present (01/17/2022)   [place]    Feeling of Stress : Not at all  Social  Connections: Moderately Isolated (01/17/2022)   Social Connection and Isolation Panel [NHANES]    Frequency of Communication with Friends and Family: More than three times a week    Frequency of Social Gatherings with Friends and Family: More than three times a week    Attends Religious Services: Never    Marine scientist or Organizations: No    Attends Music therapist: Never    Marital Status: Married    Tobacco Counseling Counseling given: Not Answered Tobacco comments:  No tobacco    Clinical Intake:  Pre-visit preparation completed: Yes  Pain : No/denies pain     BMI - recorded: 39.4 Nutritional Status: BMI > 30  Obese Nutritional Risks: None Diabetes: No  How often do you need to have someone help you when you read instructions, pamphlets, or other written materials from your doctor or pharmacy?: 1 - Never  Diabetic?  No  Interpreter Needed?: No  Information entered by :: Rolene Arbour LPN   Activities of Daily Living    01/17/2022   10:25 AM 01/17/2022   10:00 AM  In your present state of health, do you have any difficulty performing the following activities:  Hearing? 1 0  Comment Wears hearing aids   Vision? 0 0  Difficulty concentrating or making decisions? 0 0  Walking or climbing stairs? 0 0  Dressing or bathing? 0 0  Doing errands, shopping? 0 0  Preparing Food and eating ? N N  Using the Toilet? N N  In the past six months, have you accidently leaked urine? N Y  Do you have problems with loss of bowel control? N N  Managing your Medications? N N  Managing your Finances? N N  Housekeeping or managing your Housekeeping? N N    Patient Care Team: Dorothyann Peng, NP as PCP - General (Family Medicine) Azalia Bilis, MD as Referring Physician (Gastroenterology)  Indicate any recent Medical Services you may have received from other than [facility] providers in the past year (date may be approximate).     Assessment:   This is a  routine wellness examination for Wise.  Hearing/Vision screen Hearing Screening - Comments:: Wears hearing aids Vision Screening - Comments:: Wears rx glasses - up to date with routine eye exams with  [place] issues and exercise activities discussed: Current Exercise Habits: Home exercise routine, Type of exercise: walking, Time (Minutes): 50, Frequency (Times/Week): 5, Weekly Exercise (Minutes/Week): 250, Intensity: Moderate, Exercise limited by: None identified  Goals Addressed               This Visit's Progress     Lose weight (pt-stated)        Goal weight 250 lbs.       Depression Screen    01/17/2022   10:21 AM 07/02/2021   10:52 AM 06/23/2020    8:22 AM 11/08/2019    8:35 AM 11/06/2018   11:34 AM 10/21/2017    2:51 PM  PHQ 2/9 Scores  PHQ - 2 Score 0 0 0 0 0 0    Fall Risk    01/17/2022   10:27 AM 01/17/2022   10:00 AM 07/02/2021   10:52 AM 06/23/2020    8:22 AM 11/08/2019    8:35 AM  Fall Risk   Falls in the past year? 0 0 0 1 0  Number falls in past yr: 0 0 0 0 0  Injury with Fall? 0 0 0 0 0  Risk for fall due to : No Fall Risks      Follow up Falls prevention discussed  Falls evaluation completed  Falls evaluation completed    FALL RISK PREVENTION PERTAINING TO THE HOME:  Any stairs in or around the home? Yes  If so, are there any without handrails? No  Home free of loose throw rugs in walkways, pet beds, electrical cords, etc? Yes  Adequate lighting in your home to reduce risk of falls? Yes   ASSISTIVE DEVICES UTILIZED TO PREVENT FALLS:  Life alert? No  Use of a cane, walker or w/c? No  Grab bars in the bathroom? No  Shower chair or bench in shower? No  Elevated toilet seat or a handicapped toilet? No   TIMED UP AND GO:  Was the test performed? Yes .  Length of time to ambulate 10 feet: 10 sec.   Gait steady and fast without use of assistive device  Cognitive Function:        01/17/2022   10:28 AM  6CIT Screen  What  Year? 0 points  What month? 0 points  What time? 0 points  Count back from 20 0 points  Months in reverse 0 points  Repeat phrase 2 points  Total Score 2 points    Immunizations Immunization History  Administered Date(s) Administered   Fluad Quad(high Dose 65+) 01/26/2019, 06/23/2020, 01/17/2022   Influenza Split 01/18/2011, 01/23/2012   Influenza Whole 01/27/2009   Influenza,inj,Quad PF,6+ Mos 01/25/2013, 02/28/2017   Influenza-Unspecified 01/29/2021   Moderna Covid-19 Vaccine Bivalent Booster 57yrs & up 01/29/2021   Moderna Sars-Covid-2 Vaccination 06/04/2019, 07/02/2019, 04/20/2020   Pneumococcal Conjugate-13 11/06/2018   Pneumococcal Polysaccharide-23 11/08/2019   Td 05/30/1996, 07/02/2007   Tdap 10/21/2017   Zoster Recombinat (Shingrix) 11/06/2018, 01/26/2019    TDAP status: Up to date  Flu Vaccine status: Completed at today's visit  Pneumococcal vaccine status: Up to date  Covid-19 vaccine status: Completed vaccines  Qualifies for Shingles Vaccine? Yes   Zostavax completed Yes   Shingrix Completed?: Yes  Screening Tests Health Maintenance  Topic Date Due   COVID-19 Vaccine (5 - Moderna series) 02/02/2022 (Originally 06/01/2021)   TETANUS/TDAP  10/22/2027   COLONOSCOPY (Pts 45-79yrs Insurance coverage will need to be confirmed)  10/22/2028   Pneumonia Vaccine 50+ Years old  Completed   INFLUENZA VACCINE  Completed   Hepatitis C Screening  Completed   Zoster Vaccines- Shingrix  Completed   HPV VACCINES  Aged Out    Health Maintenance  There are no  preventive care reminders to display for this patient.   Colorectal cancer screening: Type of screening: Colonoscopy. Completed 10/23/18. Repeat every 10 years  Lung Cancer Screening: (Low Dose CT Chest recommended if Age 46-80 years, 30 pack-year currently smoking OR have quit w/in 15years.) does not qualify.     Additional Screening:  Hepatitis C Screening: does qualify; Completed 10/23/17  Vision  Screening: Recommended annual ophthalmology exams for early detection of glaucoma and other disorders of the eye. Is the patient up to date with their annual eye exam?  Yes  Who is the provider or what is the name of the office in which the patient attends annual eye exams? [facility] If pt is not established with a provider, would they like to be referred to a provider to establish care? No .   Dental Screening: Recommended annual dental exams for proper oral hygiene  Community Resource Referral / Chronic Care Management:  CRR required this visit?  No   CCM required this visit?  No      Plan:     I have personally reviewed and noted the following in the patient's chart:   Medical and social history Use of alcohol, tobacco or illicit drugs  Current medications and supplements including opioid prescriptions. Patient is not currently taking opioid prescriptions. Functional ability and status Nutritional status Physical activity Advanced directives List of other physicians Hospitalizations, surgeries, and ER visits in previous 12 months Vitals Screenings to include cognitive, depression, and falls Referrals and appointments  In addition, I have reviewed and discussed with patient certain preventive protocols, quality metrics, and best practice recommendations. A written personalized care plan for preventive services as well as general preventive health recommendations were provided to patient.     Criselda Peaches, LPN   [id number]   Nurse Notes: None

## 2022-02-11 ENCOUNTER — Other Ambulatory Visit: Payer: Self-pay | Admitting: Internal Medicine

## 2022-05-08 ENCOUNTER — Telehealth: Payer: Self-pay | Admitting: Adult Health

## 2022-05-08 MED ORDER — ATORVASTATIN CALCIUM 20 MG PO TABS: ORAL_TABLET | ORAL | 1 refills | Status: DC

## 2022-05-08 NOTE — Telephone Encounter (Addendum)
Pt called to ask if NP would be willing to refill his prescription for the:  atorvastatin (LIPITOR) 20 MG tablet   Pt states he is completely out.  Pt states this was prescribed by his previous provider, who he is no longer seeing.  Please advise.  LOV:  12/18/21  [id number] [name] Phone: [phone]  Fax: [phone]

## 2022-05-08 NOTE — Telephone Encounter (Signed)
Please advise 

## 2022-05-08 NOTE — Telephone Encounter (Signed)
Rx refilled.

## 2022-07-10 ENCOUNTER — Encounter: Payer: Medicare HMO | Admitting: Adult Health

## 2022-08-22 DIAGNOSIS — M25511 Pain in right shoulder: Secondary | ICD-10-CM | POA: Diagnosis not present

## 2022-09-10 DIAGNOSIS — M25511 Pain in right shoulder: Secondary | ICD-10-CM | POA: Diagnosis not present

## 2022-09-18 DIAGNOSIS — M25511 Pain in right shoulder: Secondary | ICD-10-CM | POA: Diagnosis not present

## 2022-09-20 DIAGNOSIS — M25511 Pain in right shoulder: Secondary | ICD-10-CM | POA: Diagnosis not present

## 2022-09-25 DIAGNOSIS — M25511 Pain in right shoulder: Secondary | ICD-10-CM | POA: Diagnosis not present

## 2022-11-03 ENCOUNTER — Other Ambulatory Visit: Payer: Self-pay | Admitting: Adult Health

## 2022-11-05 NOTE — Telephone Encounter (Signed)
Patient need to schedule a CPE for more refills. 

## 2022-12-12 ENCOUNTER — Encounter (INDEPENDENT_AMBULATORY_CARE_PROVIDER_SITE_OTHER): Payer: Self-pay

## 2023-01-07 ENCOUNTER — Ambulatory Visit (INDEPENDENT_AMBULATORY_CARE_PROVIDER_SITE_OTHER): Payer: Medicare HMO | Admitting: Adult Health

## 2023-01-07 ENCOUNTER — Encounter: Payer: Self-pay | Admitting: Adult Health

## 2023-01-07 VITALS — BP 140/80 | HR 53 | Temp 98.0°F | Ht 69.75 in | Wt 281.0 lb

## 2023-01-07 DIAGNOSIS — Z125 Encounter for screening for malignant neoplasm of prostate: Secondary | ICD-10-CM

## 2023-01-07 DIAGNOSIS — R030 Elevated blood-pressure reading, without diagnosis of hypertension: Secondary | ICD-10-CM

## 2023-01-07 DIAGNOSIS — Z0000 Encounter for general adult medical examination without abnormal findings: Secondary | ICD-10-CM

## 2023-01-07 DIAGNOSIS — Z6841 Body Mass Index (BMI) 40.0 and over, adult: Secondary | ICD-10-CM

## 2023-01-07 DIAGNOSIS — E785 Hyperlipidemia, unspecified: Secondary | ICD-10-CM

## 2023-01-07 DIAGNOSIS — Z23 Encounter for immunization: Secondary | ICD-10-CM | POA: Diagnosis not present

## 2023-01-07 DIAGNOSIS — E66813 Obesity, class 3: Secondary | ICD-10-CM

## 2023-01-07 LAB — CBC
HCT: 44.8 % (ref 39.0–52.0)
Hemoglobin: 14.7 g/dL (ref 13.0–17.0)
MCHC: 32.8 g/dL (ref 30.0–36.0)
MCV: 87.4 fl (ref 78.0–100.0)
Platelets: 181 10*3/uL (ref 150.0–400.0)
RBC: 5.13 Mil/uL (ref 4.22–5.81)
RDW: 13.4 % (ref 11.5–15.5)
WBC: 6.3 10*3/uL (ref 4.0–10.5)

## 2023-01-07 LAB — COMPREHENSIVE METABOLIC PANEL
ALT: 22 U/L (ref 0–53)
AST: 18 U/L (ref 0–37)
Albumin: 3.9 g/dL (ref 3.5–5.2)
Alkaline Phosphatase: 84 U/L (ref 39–117)
BUN: 18 mg/dL (ref 6–23)
CO2: 30 meq/L (ref 19–32)
Calcium: 9.4 mg/dL (ref 8.4–10.5)
Chloride: 104 meq/L (ref 96–112)
Creatinine, Ser: 1.06 mg/dL (ref 0.40–1.50)
GFR: 71.74 mL/min (ref >60.00)
Glucose, Bld: 94 mg/dL (ref 70–99)
Potassium: 4.4 meq/L (ref 3.5–5.1)
Sodium: 140 meq/L (ref 135–145)
Total Bilirubin: 0.6 mg/dL (ref 0.2–1.2)
Total Protein: 6.7 g/dL (ref 6.0–8.3)

## 2023-01-07 LAB — LIPID PANEL
Cholesterol: 207 mg/dL — ABNORMAL HIGH (ref 0–200)
HDL: 40 mg/dL (ref >39.00)
LDL Cholesterol: 130 mg/dL — ABNORMAL HIGH (ref 0–99)
NonHDL: 167.1
Total CHOL/HDL Ratio: 5
Triglycerides: 188 mg/dL — ABNORMAL HIGH (ref 0.0–149.0)
VLDL: 37.6 mg/dL (ref 0.0–40.0)

## 2023-01-07 LAB — HEMOGLOBIN A1C: Hgb A1c MFr Bld: 5.4 % (ref 4.6–6.5)

## 2023-01-07 LAB — PSA: PSA: 3.33 ng/mL (ref 0.10–4.00)

## 2023-01-07 LAB — TSH: TSH: 1.58 u[IU]/mL (ref 0.35–5.50)

## 2023-01-07 MED ORDER — ATORVASTATIN CALCIUM 20 MG PO TABS
ORAL_TABLET | ORAL | 3 refills | Status: DC
Start: 2023-01-07 — End: 2024-02-20

## 2023-01-07 NOTE — Progress Notes (Signed)
Subjective:    Patient ID: Jermaine Johnson, male    DOB: [birth year], 67 y.o.   MRN: [id number]  HPI  Patient presents for yearly preventative medicine examination. He is a pleasant 67 year old male who  has a past medical history of Allergy, CALCANEAL SPUR, RIGHT (03/20/2010), History of colon polyps, Migraine, ONYCHOMYCOSIS (11/12/2007), and ROTATOR CUFF INJURY, LEFT SHOULDER (07/29/2007).  Hyperlipidemia - he has been out of lipitor for the last two months.  Lab Results  Component Value Date   CHOL 161 03/06/2021   HDL 41.30 03/06/2021   LDLCALC 89 03/06/2021   LDLDIRECT 99.2 01/18/2013   TRIG 158.0 (H) 03/06/2021   CHOLHDL 4 03/06/2021   All immunizations and health maintenance protocols were reviewed with the patient and needed orders were placed.  Appropriate screening laboratory values were ordered for the patient including screening of hyperlipidemia, renal function and hepatic function. If indicated by BPH, a PSA was ordered.  Medication reconciliation,  past medical history, social history, problem list and allergies were reviewed in detail with the patient  Goals were established with regard to weight loss, exercise, and  diet in compliance with medications. His weight is up 16 pounds over the last year. He got out of his walking routine and his diet has suffered.   Wt Readings from Last 3 Encounters:  01/07/23 281 lb (127.5 kg)  01/17/22 266 lb 12.8 oz (121 kg)  12/18/21 265 lb (120.2 kg)    He is  up to date on routine colon cancer screening   Review of Systems  Constitutional: Negative.   HENT: Negative.    Eyes: Negative.   Respiratory: Negative.    Cardiovascular: Negative.   Gastrointestinal: Negative.   Endocrine: Negative.   Genitourinary: Negative.   Musculoskeletal: Negative.   Skin: Negative.   Allergic/Immunologic: Negative.   Neurological: Negative.   Hematological: Negative.   Psychiatric/Behavioral: Negative.    All other systems reviewed and  are negative.  Past Medical History:  Diagnosis Date   Allergy    CALCANEAL SPUR, RIGHT 03/20/2010   History of colon polyps    Migraine    ONYCHOMYCOSIS 11/12/2007   ROTATOR CUFF INJURY, LEFT SHOULDER 07/29/2007    Social History   Socioeconomic History   Marital status: Married    Spouse name: Not on file   Number of children: 2   Years of education: Not on file   Highest education level: Associate degree: academic program  Occupational History   Occupation: retired-IT   Tobacco Use   Smoking status: Never   Smokeless tobacco: Never   Tobacco comments:     No tobacco   Substance and Sexual Activity   Alcohol use: Not Currently    Comment: socially   Drug use: Never   Sexual activity: Yes  Other Topics Concern   Not on file  Social History Narrative   Married (wife Linkyn Hendler ); household includes his daughter    2 children-michael  katie      Retired  Consulting civil engineer support, networking   Hobbies: build stuff, sail, artist-painting, drawing, sculpture, pencil.    Social Determinants of Health   Financial Resource Strain: Low Risk  (01/06/2023)   Overall Financial Resource Strain (CARDIA)    Difficulty of Paying Living Expenses: Not hard at all  Food Insecurity: No Food Insecurity (01/06/2023)   Hunger Vital Sign    Worried About Running Out of Food in the Last Year: Never true    Ran Out of  Food in the Last Year: Never true  Transportation Needs: No Transportation Needs (01/06/2023)   PRAPARE - Administrator, Civil Service (Medical): No    Lack of Transportation (Non-Medical): No  Physical Activity: Sufficiently Active (01/06/2023)   Exercise Vital Sign    Days of Exercise per Week: 7 days    Minutes of Exercise per Session: 30 min  Stress: No Stress Concern Present (01/06/2023)   [employer] of Occupational Health - Occupational Stress Questionnaire    Feeling of Stress : Only a little  Social Connections: Moderately Integrated (01/06/2023)   Social Connection  and Isolation Panel [NHANES]    Frequency of Communication with Friends and Family: More than three times a week    Frequency of Social Gatherings with Friends and Family: Twice a week    Attends Religious Services: 1 to 4 times per year    Active Member of [employer] or Organizations: No    Attends Banker Meetings: Never    Marital Status: Married  Catering manager Violence: Not At Risk (01/17/2022)   Humiliation, Afraid, Rape, and Kick questionnaire    Fear of Current or Ex-Partner: No    Emotionally Abused: No    Physically Abused: No    Sexually Abused: No    Past Surgical History:  Procedure Laterality Date   APPENDECTOMY     HERNIA REPAIR     inguinal-not sure of side   TONSILLECTOMY AND ADENOIDECTOMY  1952   VASECTOMY      Family History  Problem Relation Age of Onset   Heart disease Mother        CABG x2 at 63. Surgery for "enlarged heart" later   Hyperlipidemia Mother    Diabetes Father    Alzheimer's disease Father    Colon cancer Brother        in 25s   Colon cancer Sister        6   ALS Brother        other brother   Prostate cancer Neg Hx     No Known Allergies  Current Outpatient Medications on File Prior to Visit  Medication Sig Dispense Refill   ciclopirox (PENLAC) 8 % solution Apply topically at bedtime. Apply over nail and surrounding skin. Apply daily over previous coat. After seven (7) days, may remove with alcohol and continue cycle. Use for daily for 1 year. 6.6 mL 11   ibuprofen (ADVIL,MOTRIN) 200 MG tablet Take 200 mg by mouth every 6 (six) hours as needed.     naproxen (NAPROSYN) 250 MG tablet Take 250 mg by mouth 2 (two) times daily with a meal.     Omega-3 Krill Oil 300 MG CAPS Take 1 capsule (300 mg total) by mouth 2 (two) times daily.     No current facility-administered medications on file prior to visit.    BP (!) 140/80   Pulse (!) 53   Temp 98 F (36.7 C) (Oral)   Ht 5' 9.75" (1.772 m)   Wt 281 lb (127.5 kg)   SpO2  98%   BMI 40.61 kg/m       Objective:   Physical Exam Vitals and nursing note reviewed.  Constitutional:      General: He is not in acute distress.    Appearance: Normal appearance. He is obese. He is not ill-appearing.  HENT:     Head: Normocephalic and atraumatic.     Right Ear: Tympanic membrane, ear canal and external ear normal. There  is no impacted cerumen.     Left Ear: Tympanic membrane, ear canal and external ear normal. There is no impacted cerumen.     Nose: Nose normal. No congestion or rhinorrhea.     Mouth/Throat:     Mouth: Mucous membranes are moist.     Pharynx: Oropharynx is clear.  Eyes:     Extraocular Movements: Extraocular movements intact.     Conjunctiva/sclera: Conjunctivae normal.     Pupils: Pupils are equal, round, and reactive to light.  Neck:     Vascular: No carotid bruit.  Cardiovascular:     Rate and Rhythm: Normal rate and regular rhythm.     Pulses: Normal pulses.     Heart sounds: No murmur heard.    No friction rub. No gallop.  Pulmonary:     Effort: Pulmonary effort is normal.     Breath sounds: Normal breath sounds.  Abdominal:     General: Abdomen is flat. Bowel sounds are normal. There is no distension.     Palpations: Abdomen is soft. There is no mass.     Tenderness: There is no abdominal tenderness. There is no guarding or rebound.     Hernia: No hernia is present.  Musculoskeletal:        General: Normal range of motion.     Cervical back: Normal range of motion and neck supple.  Lymphadenopathy:     Cervical: No cervical adenopathy.  Skin:    General: Skin is warm and dry.     Capillary Refill: Capillary refill takes less than 2 seconds.  Neurological:     General: No focal deficit present.     Mental Status: He is alert and oriented to person, place, and time.  Psychiatric:        Mood and Affect: Mood normal.        Behavior: Behavior normal.        Thought Content: Thought content normal.        Judgment: Judgment  normal.       Assessment & Plan:  1. Routine general medical examination at a health care facility Today patient counseled on age appropriate routine health concerns for screening and prevention, each reviewed and up to date or declined. Immunizations reviewed and up to date or declined. Labs ordered and reviewed. Risk factors for depression reviewed and negative. Hearing function and visual acuity are intact. ADLs screened and addressed as needed. Functional ability and level of safety reviewed and appropriate. Education, counseling and referrals performed based on assessed risks today. Patient provided with a copy of personalized plan for preventive services. - Follow up in one year or sooner if needed - Flu shot given today   2. Dyslipidemia  - Lipid panel; Future - TSH; Future - CBC; Future - Comprehensive metabolic panel; Future - atorvastatin (LIPITOR) 20 MG tablet; TAKE 1 TABLET BY MOUTH EVERYDAY AT BEDTIME  Dispense: 90 tablet; Refill: 3  3. Prostate cancer screening  - PSA; Future  4. Elevated blood pressure reading - Likely due to weight gain - Monitor BP at home.  - Work on weight loss and follow up in 3 months  - Lipid panel; Future - TSH; Future - CBC; Future - Comprehensive metabolic panel; Future  5. Class 3 severe obesity due to excess calories without serious comorbidity with body mass index (BMI) of 40.0 to 44.9 in adult [facility]) - Work on weight loss - Follow up in 3 months  - Lipid panel; Future - TSH;  Future - CBC; Future - Comprehensive metabolic panel; Future - Hemoglobin A1c; Future   Shirline Frees, NP

## 2023-01-07 NOTE — Patient Instructions (Signed)
It was great seeing you today   We will follow up with you regarding your lab work   Please let me know if you need anything   Please monitor your blood pressure at home and follow up in 3 months - need to work on weight loss to bring blood pressure down

## 2023-01-09 NOTE — Addendum Note (Signed)
Addended by: Waymon Amato R on: 01/09/2023 12:18 PM   Modules accepted: Orders

## 2023-02-26 ENCOUNTER — Ambulatory Visit: Payer: Medicare HMO

## 2023-02-26 VITALS — BP 120/60 | HR 64 | Temp 97.9°F | Ht 69.75 in | Wt 276.0 lb

## 2023-02-26 DIAGNOSIS — Z0000 Encounter for general adult medical examination without abnormal findings: Secondary | ICD-10-CM | POA: Diagnosis not present

## 2023-02-26 NOTE — Progress Notes (Signed)
Subjective:   Jermaine Johnson is a 67 y.o. male who presents for Medicare Annual/Subsequent preventive examination.  Visit Complete: In person      Objective:    Today's Vitals   02/26/23 1004  BP: 120/60  Pulse: 64  Temp: 97.9 F (36.6 C)  TempSrc: Oral  SpO2: 97%  Weight: 276 lb (125.2 kg)  Height: 5' 9.75" (1.772 m)   Body mass index is 39.89 kg/m.     02/26/2023   10:29 AM 01/17/2022   10:28 AM  Advanced Directives  Does Patient Have a Medical Advance Directive? No No  Would patient like information on creating a medical advance directive? No - Patient declined No - Patient declined    Current Medications (verified) Outpatient Encounter Medications as of 02/26/2023  Medication Sig   atorvastatin (LIPITOR) 20 MG tablet TAKE 1 TABLET BY MOUTH EVERYDAY AT BEDTIME   ciclopirox (PENLAC) 8 % solution Apply topically at bedtime. Apply over nail and surrounding skin. Apply daily over previous coat. After seven (7) days, may remove with alcohol and continue cycle. Use for daily for 1 year.   ibuprofen (ADVIL,MOTRIN) 200 MG tablet Take 200 mg by mouth every 6 (six) hours as needed.   naproxen (NAPROSYN) 250 MG tablet Take 250 mg by mouth 2 (two) times daily with a meal.   Omega-3 Krill Oil 300 MG CAPS Take 1 capsule (300 mg total) by mouth 2 (two) times daily.   No facility-administered encounter medications on file as of 02/26/2023.    Allergies (verified) Patient has no known allergies.   History: Past Medical History:  Diagnosis Date   Allergy    CALCANEAL SPUR, RIGHT 03/20/2010   History of colon polyps    Migraine    ONYCHOMYCOSIS 11/12/2007   ROTATOR CUFF INJURY, LEFT SHOULDER 07/29/2007   Past Surgical History:  Procedure Laterality Date   APPENDECTOMY     HERNIA REPAIR     inguinal-not sure of side   TONSILLECTOMY AND ADENOIDECTOMY  1952   VASECTOMY     Family History  Problem Relation Age of Onset   Heart disease Mother        CABG x2 at 73.  Surgery for "enlarged heart" later   Hyperlipidemia Mother    Diabetes Father    Alzheimer's disease Father    Colon cancer Brother        in 61s   Colon cancer Sister        72   ALS Brother        other brother   Prostate cancer Neg Hx    Social History   Socioeconomic History   Marital status: Married    Spouse name: Not on file   Number of children: 2   Years of education: Not on file   Highest education level: Associate degree: academic program  Occupational History   Occupation: retired-IT   Tobacco Use   Smoking status: Never   Smokeless tobacco: Never   Tobacco comments:     No tobacco   Substance and Sexual Activity   Alcohol use: Not Currently    Comment: socially   Drug use: Never   Sexual activity: Yes  Other Topics Concern   Not on file  Social History Narrative   Married (wife Michale Fatula ); household includes his daughter    2 children-michael  katie      Retired  Consulting civil engineer support, networking   Hobbies: build stuff, sail, artist-painting, drawing, sculpture, pencil.  Social Determinants of Health   Financial Resource Strain: Low Risk  (02/26/2023)   Overall Financial Resource Strain (CARDIA)    Difficulty of Paying Living Expenses: Not hard at all  Food Insecurity: No Food Insecurity (02/26/2023)   Hunger Vital Sign    Worried About Running Out of Food in the Last Year: Never true    Ran Out of Food in the Last Year: Never true  Transportation Needs: No Transportation Needs (02/26/2023)   PRAPARE - Administrator, Civil Service (Medical): No    Lack of Transportation (Non-Medical): No  Physical Activity: Sufficiently Active (02/26/2023)   Exercise Vital Sign    Days of Exercise per Week: 7 days    Minutes of Exercise per Session: 30 min  Stress: No Stress Concern Present (02/26/2023)   [employer] of Occupational Health - Occupational Stress Questionnaire    Feeling of Stress : Not at all  Social Connections: Socially Integrated  (02/26/2023)   Social Connection and Isolation Panel [NHANES]    Frequency of Communication with Friends and Family: More than three times a week    Frequency of Social Gatherings with Friends and Family: More than three times a week    Attends Religious Services: More than 4 times per year    Active Member of [employer] or Organizations: Yes    Attends Engineer, structural: More than 4 times per year    Marital Status: Married    Tobacco Counseling Counseling given: Not Answered Tobacco comments:  No tobacco    Clinical Intake:  Pre-visit preparation completed: Yes  Pain : No/denies pain     BMI - recorded: 39.89 Nutritional Status: BMI > 30  Obese Nutritional Risks: None Diabetes: No  How often do you need to have someone help you when you read instructions, pamphlets, or other written materials from your doctor or pharmacy?: 1 - Never  Interpreter Needed?: No  Information entered by :: Theresa Mulligan LPN   Activities of Daily Living    02/26/2023   10:28 AM 01/07/2023    8:35 AM  In your present state of health, do you have any difficulty performing the following activities:  Hearing? 1 1  Comment Wears hearing aids has hearing aids but doesnt use them  Vision? 0 0  Difficulty concentrating or making decisions? 0 0  Walking or climbing stairs? 0 0  Dressing or bathing? 0 0  Doing errands, shopping? 0 0  Preparing Food and eating ? N   Using the Toilet? N   In the past six months, have you accidently leaked urine? N   Do you have problems with loss of bowel control? N   Managing your Medications? N   Managing your Finances? N   Housekeeping or managing your Housekeeping? N     Patient Care Team: Shirline Frees, NP as PCP - General (Family Medicine) Barbaraann Share, MD as Referring Physician (Gastroenterology)  Indicate any recent Medical Services you may have received from other than [facility] providers in the past year (date may be approximate).      Assessment:   This is a routine wellness examination for Wiley.  Hearing/Vision screen Hearing Screening - Comments:: Wears hearing aids Vision Screening - Comments:: Wears rx glasses - up to date with routine [facility] exams with  [facility]   Goals Addressed               This Visit's Progress     Increase physical activity (  pt-stated)        Lose weight!       Depression Screen    02/26/2023   10:08 AM 01/07/2023    9:09 AM 01/17/2022   10:21 AM 07/02/2021   10:52 AM 06/23/2020    8:22 AM 11/08/2019    8:35 AM 11/06/2018   11:34 AM  PHQ 2/9 Scores  PHQ - 2 Score 0 0 0 0 0 0 0  PHQ- 9 Score 0 1         Fall Risk    02/26/2023   10:28 AM 01/07/2023    9:09 AM 01/06/2023    2:34 PM 01/17/2022   10:27 AM 01/17/2022   10:00 AM  Fall Risk   Falls in the past year? 0 0 0 0 0  Number falls in past yr: 0 0  0 0  Injury with Fall? 0 0  0 0  Risk for fall due to : No Fall Risks No Fall Risks  No Fall Risks   Follow up Falls prevention discussed Falls evaluation completed  Falls prevention discussed     MEDICARE RISK AT HOME: Medicare Risk at Home Any stairs in or around the home?: Yes If so, are there any without handrails?: No Home free of loose throw rugs in walkways, pet beds, electrical cords, etc?: Yes Adequate lighting in your home to reduce risk of falls?: Yes Life alert?: No Use of a cane, walker or w/c?: No Grab bars in the bathroom?: No Shower chair or bench in shower?: No Elevated toilet seat or a handicapped toilet?: Yes  TIMED UP AND GO:  Was the test performed?  Yes  Length of time to ambulate 10 feet: 10 sec Gait steady and fast without use of assistive device    Cognitive Function:        02/26/2023   10:29 AM 01/17/2022   10:28 AM  6CIT Screen  What Year? 0 points 0 points  What month? 0 points 0 points  What time? 0 points 0 points  Count back from 20 0 points 0 points  Months in reverse 0 points 0 points  Repeat phrase 0 points 2  points  Total Score 0 points 2 points    Immunizations Immunization History  Administered Date(s) Administered   Fluad Quad(high Dose 65+) 01/26/2019, 06/23/2020, 01/17/2022   Fluad Trivalent(High Dose 65+) 01/07/2023   Influenza Split 01/18/2011, 01/23/2012   Influenza Whole 01/27/2009   Influenza,inj,Quad PF,6+ Mos 01/25/2013, 02/28/2017   Influenza-Unspecified 01/29/2021   Moderna Covid-19 Vaccine Bivalent Booster 59yrs & up 01/29/2021   Moderna Sars-Covid-2 Vaccination 06/04/2019, 07/02/2019, 04/20/2020   Pneumococcal Conjugate-13 11/06/2018   Pneumococcal Polysaccharide-23 11/08/2019   Td 05/30/1996, 07/02/2007   Tdap 10/21/2017   Zoster Recombinant(Shingrix) 11/06/2018, 01/26/2019    TDAP status: Up to date  Flu Vaccine status: Up to date  Pneumococcal vaccine status: Up to date  Covid-19 vaccine status: Declined, Education has been provided regarding the importance of this vaccine but patient still declined. Advised may receive this vaccine at local pharmacy or Health Dept.or vaccine clinic. Aware to provide a copy of the vaccination record if obtained from local pharmacy or Health Dept. Verbalized acceptance and understanding.  Qualifies for Shingles Vaccine? Yes   Zostavax completed Yes   Shingrix Completed?: Yes  Screening Tests Health Maintenance  Topic Date Due   COVID-19 Vaccine (5 - 2023-24 season) 12/29/2022   Medicare Annual Wellness (AWV)  02/26/2024   DTaP/Tdap/Td (4 - Td or Tdap) 10/22/2027  Colonoscopy  10/22/2028   Pneumonia Vaccine 25+ Years old  Completed   INFLUENZA VACCINE  Completed   Hepatitis C Screening  Completed   Zoster Vaccines- Shingrix  Completed   HPV VACCINES  Aged Out    Health Maintenance  Health Maintenance Due  Topic Date Due   COVID-19 Vaccine (5 - 2023-24 season) 12/29/2022    Colorectal cancer screening: Type of screening: Colonoscopy. Completed 11/06/18. Repeat every 10 years   Additional Screening:  Hepatitis C  Screening: does qualify; Completed 10/23/17  Vision Screening: Recommended annual ophthalmology exams for early detection of glaucoma and other disorders of the eye. Is the patient up to date with their annual eye exam?  Yes  Who is the provider or what is the name of the office in which the patient attends annual eye exams? [facility] If pt is not established with a provider, would they like to be referred to a provider to establish care? No .   Dental Screening: Recommended annual dental exams for proper oral hygiene    Community Resource Referral / Chronic Care Management: CRR required this visit?  No   CCM required this visit?  No     Plan:     I have personally reviewed and noted the following in the patient's chart:   Medical and social history Use of alcohol, tobacco or illicit drugs  Current medications and supplements including opioid prescriptions. Patient is not currently taking opioid prescriptions. Functional ability and status Nutritional status Physical activity Advanced directives List of other physicians Hospitalizations, surgeries, and ER visits in previous 12 months Vitals Screenings to include cognitive, depression, and falls Referrals and appointments  In addition, I have reviewed and discussed with patient certain preventive protocols, quality metrics, and best practice recommendations. A written personalized care plan for preventive services as well as general preventive health recommendations were provided to patient.     Tillie Rung, LPN   09/81/1914   After Visit Summary: Given  Nurse Notes: None

## 2023-02-26 NOTE — Patient Instructions (Addendum)
Jermaine Johnson , Thank you for taking time to come for your Medicare Wellness Visit. I appreciate your ongoing commitment to your health goals. Please review the following plan we discussed and let me know if I can assist you in the future.   Referrals/Orders/Follow-Ups/Clinician Recommendations:   This is a list of the screening recommended for you and due dates:  Health Maintenance  Topic Date Due   COVID-19 Vaccine (5 - 2023-24 season) 12/29/2022   Medicare Annual Wellness Visit  02/26/2024   DTaP/Tdap/Td vaccine (4 - Td or Tdap) 10/22/2027   Colon Cancer Screening  10/22/2028   Pneumonia Vaccine  Completed   Flu Shot  Completed   Hepatitis C Screening  Completed   Zoster (Shingles) Vaccine  Completed   HPV Vaccine  Aged Out    Advanced directives: (Declined) Advance directive discussed with you today. Even though you declined this today, please call our office should you change your mind, and we can give you the proper paperwork for you to fill out.  Next Medicare Annual Wellness Visit scheduled for next year: Yes

## 2023-04-08 ENCOUNTER — Ambulatory Visit (INDEPENDENT_AMBULATORY_CARE_PROVIDER_SITE_OTHER): Payer: Medicare HMO | Admitting: Adult Health

## 2023-04-08 ENCOUNTER — Encounter: Payer: Self-pay | Admitting: Adult Health

## 2023-04-08 VITALS — BP 122/70 | HR 49 | Temp 98.0°F | Ht 69.75 in | Wt 278.0 lb

## 2023-04-08 DIAGNOSIS — R030 Elevated blood-pressure reading, without diagnosis of hypertension: Secondary | ICD-10-CM | POA: Diagnosis not present

## 2023-04-08 DIAGNOSIS — Z6841 Body Mass Index (BMI) 40.0 and over, adult: Secondary | ICD-10-CM | POA: Diagnosis not present

## 2023-04-08 DIAGNOSIS — E66813 Obesity, class 3: Secondary | ICD-10-CM

## 2023-04-08 NOTE — Progress Notes (Signed)
Subjective:    Patient ID: Jermaine Johnson, male    DOB: [birth year], 67 y.o.   MRN: [id number]  HPI  67 year old male who  has a past medical history of Allergy, CALCANEAL SPUR, RIGHT (03/20/2010), History of colon polyps, Migraine, ONYCHOMYCOSIS (11/12/2007), and ROTATOR CUFF INJURY, LEFT SHOULDER (07/29/2007).  He presents to the office today for follow-up regarding hypertension and obesity.  When he was last seen in September 2024 his weight was up 15 pounds over the year.  His diet had been suffering and he was out of his walking routine.  Was also noted that his blood pressure was slightly elevated at 140/80, he was not on any blood pressure medication at this time.  He was encouraged to get back into eating a healthy diet and exercising on a routine basis and follow-up in 3 months  Today he reports that over the last three months he has been hiking in [place] and at [facility]. Since he has been back home he has been exercising at home and has signed up to go to [employer]. He has been monitoring his blood pressure at home with readings in the 120's/70's.   Wt Readings from Last 3 Encounters:  04/08/23 278 lb (126.1 kg)  02/26/23 276 lb (125.2 kg)  01/07/23 281 lb (127.5 kg)   BP Readings from Last 3 Encounters:  04/08/23 122/70  02/26/23 120/60  01/07/23 (!) 140/80   Review of Systems See HPI   Past Medical History:  Diagnosis Date   Allergy    CALCANEAL SPUR, RIGHT 03/20/2010   History of colon polyps    Migraine    ONYCHOMYCOSIS 11/12/2007   ROTATOR CUFF INJURY, LEFT SHOULDER 07/29/2007    Social History   Socioeconomic History   Marital status: Married    Spouse name: Not on file   Number of children: 2   Years of education: Not on file   Highest education level: Associate degree: academic program  Occupational History   Occupation: retired-IT   Tobacco Use   Smoking status: Never   Smokeless tobacco: Never   Tobacco comments:     No tobacco    Substance and Sexual Activity   Alcohol use: Not Currently    Comment: socially   Drug use: Never   Sexual activity: Yes  Other Topics Concern   Not on file  Social History Narrative   Married (wife Chadwyck Schuchmann ); household includes his daughter    2 children-michael  katie      Retired  Consulting civil engineer support, networking   Hobbies: build stuff, sail, artist-painting, drawing, sculpture, pencil.    Social Determinants of Health   Financial Resource Strain: Low Risk  (02/26/2023)   Overall Financial Resource Strain (CARDIA)    Difficulty of Paying Living Expenses: Not hard at all  Food Insecurity: No Food Insecurity (02/26/2023)   Hunger Vital Sign    Worried About Running Out of Food in the Last Year: Never true    Ran Out of Food in the Last Year: Never true  Transportation Needs: No Transportation Needs (02/26/2023)   PRAPARE - Administrator, Civil Service (Medical): No    Lack of Transportation (Non-Medical): No  Physical Activity: Sufficiently Active (02/26/2023)   Exercise Vital Sign    Days of Exercise per Week: 7 days    Minutes of Exercise per Session: 30 min  Stress: No Stress Concern Present (02/26/2023)   [employer] of Occupational Health -  Occupational Stress Questionnaire    Feeling of Stress : Not at all  Social Connections: Socially Integrated (02/26/2023)   Social Connection and Isolation Panel [NHANES]    Frequency of Communication with Friends and Family: More than three times a week    Frequency of Social Gatherings with Friends and Family: More than three times a week    Attends Religious Services: More than 4 times per year    Active Member of [employer] or Organizations: Yes    Attends Engineer, structural: More than 4 times per year    Marital Status: Married  Catering manager Violence: Not At Risk (02/26/2023)   Humiliation, Afraid, Rape, and Kick questionnaire    Fear of Current or Ex-Partner: No    Emotionally Abused: No    Physically  Abused: No    Sexually Abused: No    Past Surgical History:  Procedure Laterality Date   APPENDECTOMY     HERNIA REPAIR     inguinal-not sure of side   TONSILLECTOMY AND ADENOIDECTOMY  1952   VASECTOMY      Family History  Problem Relation Age of Onset   Heart disease Mother        CABG x2 at 33. Surgery for "enlarged heart" later   Hyperlipidemia Mother    Diabetes Father    Alzheimer's disease Father    Colon cancer Brother        in 23s   Colon cancer Sister        74   ALS Brother        other brother   Prostate cancer Neg Hx     No Known Allergies  Current Outpatient Medications on File Prior to Visit  Medication Sig Dispense Refill   atorvastatin (LIPITOR) 20 MG tablet TAKE 1 TABLET BY MOUTH EVERYDAY AT BEDTIME 90 tablet 3   ciclopirox (PENLAC) 8 % solution Apply topically at bedtime. Apply over nail and surrounding skin. Apply daily over previous coat. After seven (7) days, may remove with alcohol and continue cycle. Use for daily for 1 year. 6.6 mL 11   naproxen (NAPROSYN) 250 MG tablet Take 250 mg by mouth 2 (two) times daily with a meal.     Omega-3 Krill Oil 300 MG CAPS Take 1 capsule (300 mg total) by mouth 2 (two) times daily.     No current facility-administered medications on file prior to visit.    BP 122/70   Pulse (!) 49   Temp 98 F (36.7 C) (Oral)   Ht 5' 9.75" (1.772 m)   Wt 278 lb (126.1 kg)   SpO2 98%   BMI 40.18 kg/m       Objective:   Physical Exam Vitals and nursing note reviewed.  Constitutional:      Appearance: Normal appearance. He is obese.  Cardiovascular:     Rate and Rhythm: Normal rate and regular rhythm.     Pulses: Normal pulses.     Heart sounds: Normal heart sounds.  Pulmonary:     Effort: Pulmonary effort is normal.     Breath sounds: Normal breath sounds.  Skin:    General: Skin is warm and dry.  Neurological:     General: No focal deficit present.     Mental Status: He is alert and oriented to person,  place, and time.  Psychiatric:        Mood and Affect: Mood normal.        Behavior: Behavior normal.  Thought Content: Thought content normal.        Judgment: Judgment normal.       Assessment & Plan:  1. Elevated blood pressure reading - At goal   2. Class 3 severe obesity due to excess calories without serious comorbidity with body mass index (BMI) of 40.0 to 44.9 in adult [facility]) - Continue to work on weight loss through diet and exercise   Shirline Frees, NP  Time spent with patient today was 31 minutes which consisted of chart review, discussing elevated blood pressure and obesity work up, treatment answering questions and documentation.

## 2023-05-06 ENCOUNTER — Encounter: Payer: Self-pay | Admitting: Adult Health

## 2023-05-07 ENCOUNTER — Other Ambulatory Visit: Payer: Self-pay | Admitting: Adult Health

## 2023-05-07 MED ORDER — SCOPOLAMINE 1 MG/3DAYS TD PT72: 1.0000 | MEDICATED_PATCH | TRANSDERMAL | 0 refills | Status: DC

## 2023-05-07 NOTE — Telephone Encounter (Signed)
**Note De-identified  [place] Obfuscation** Please advise 

## 2023-05-13 DIAGNOSIS — D225 Melanocytic nevi of trunk: Secondary | ICD-10-CM | POA: Diagnosis not present

## 2023-05-13 DIAGNOSIS — L820 Inflamed seborrheic keratosis: Secondary | ICD-10-CM | POA: Diagnosis not present

## 2023-05-13 DIAGNOSIS — D2271 Melanocytic nevi of right lower limb, including hip: Secondary | ICD-10-CM | POA: Diagnosis not present

## 2023-05-13 DIAGNOSIS — D2272 Melanocytic nevi of left lower limb, including hip: Secondary | ICD-10-CM | POA: Diagnosis not present

## 2023-05-13 DIAGNOSIS — D2262 Melanocytic nevi of left upper limb, including shoulder: Secondary | ICD-10-CM | POA: Diagnosis not present

## 2023-05-13 DIAGNOSIS — L821 Other seborrheic keratosis: Secondary | ICD-10-CM | POA: Diagnosis not present

## 2023-05-13 DIAGNOSIS — C44311 Basal cell carcinoma of skin of nose: Secondary | ICD-10-CM | POA: Diagnosis not present

## 2023-05-13 DIAGNOSIS — L814 Other melanin hyperpigmentation: Secondary | ICD-10-CM | POA: Diagnosis not present

## 2023-05-13 DIAGNOSIS — D2261 Melanocytic nevi of right upper limb, including shoulder: Secondary | ICD-10-CM | POA: Diagnosis not present

## 2023-06-24 DIAGNOSIS — C44311 Basal cell carcinoma of skin of nose: Secondary | ICD-10-CM | POA: Diagnosis not present

## 2023-06-24 DIAGNOSIS — Z85828 Personal history of other malignant neoplasm of skin: Secondary | ICD-10-CM | POA: Diagnosis not present

## 2023-06-26 ENCOUNTER — Encounter: Payer: Self-pay | Admitting: Adult Health

## 2023-06-26 ENCOUNTER — Ambulatory Visit: Payer: Medicare HMO

## 2023-06-26 ENCOUNTER — Ambulatory Visit (INDEPENDENT_AMBULATORY_CARE_PROVIDER_SITE_OTHER): Payer: Medicare HMO | Admitting: Adult Health

## 2023-06-26 VITALS — BP 120/82 | HR 72 | Temp 98.0°F | Ht 69.75 in | Wt 275.0 lb

## 2023-06-26 DIAGNOSIS — M25572 Pain in left ankle and joints of left foot: Secondary | ICD-10-CM

## 2023-06-26 DIAGNOSIS — M7732 Calcaneal spur, left foot: Secondary | ICD-10-CM | POA: Diagnosis not present

## 2023-06-26 DIAGNOSIS — M7989 Other specified soft tissue disorders: Secondary | ICD-10-CM | POA: Diagnosis not present

## 2023-06-26 LAB — URIC ACID: Uric Acid, Serum: 7.6 mg/dL (ref 4.0–7.8)

## 2023-06-26 MED ORDER — IBUPROFEN 800 MG PO TABS: 800.0000 mg | ORAL_TABLET | Freq: Three times a day (TID) | ORAL | 1 refills | Status: AC | PRN

## 2023-06-26 NOTE — Progress Notes (Signed)
 Subjective:    Patient ID: Jermaine Johnson, male    DOB: Nov 29, 1955, 68 y.o.   MRN: [id number]  Ankle Pain     68 year old male who  has a past medical history of Allergy, CALCANEAL SPUR, RIGHT (03/20/2010), History of colon polyps, Migraine, ONYCHOMYCOSIS (11/12/2007), and ROTATOR CUFF INJURY, LEFT SHOULDER (07/29/2007).  He presents to the office today for an acute issue.   He reports that about 4-5 days ago he developed pain and swelling to his left ankle. The pain was mild  at first and then got worse to the point that he could not place weight on his left foot. He then started taking Ibuprofen 800mg   and pain has improved but swelling continues. He is able to bear weight currently. He has not noticed any redness or warmth to the site of pain.   He does not have a known history of gout.   He denies trauma or aggravating injury     Review of Systems See HPI   Past Medical History:  Diagnosis Date   Allergy    CALCANEAL SPUR, RIGHT 03/20/2010   History of colon polyps    Migraine    ONYCHOMYCOSIS 11/12/2007   ROTATOR CUFF INJURY, LEFT SHOULDER 07/29/2007    Social History   Socioeconomic History   Marital status: Married    Spouse name: Not on file   Number of children: 2   Years of education: Not on file   Highest education level: Associate degree: academic program  Occupational History   Occupation: retired-IT   Tobacco Use   Smoking status: Never   Smokeless tobacco: Never   Tobacco comments:     No tobacco   Substance and Sexual Activity   Alcohol use: Not Currently    Comment: socially   Drug use: Never   Sexual activity: Yes  Other Topics Concern   Not on file  Social History Narrative   Married (wife Jermaine Johnson ); household includes his daughter    2 children-Jermaine Johnson  Jermaine Johnson      Retired  Consulting civil engineer support, networking   Hobbies: build stuff, sail, artist-painting, drawing, sculpture, pencil.    Social Drivers of Corporate investment banker Strain: Low Risk   (02/26/2023)   Overall Financial Resource Strain (CARDIA)    Difficulty of Paying Living Expenses: Not hard at all  Food Insecurity: No Food Insecurity (02/26/2023)   Hunger Vital Sign    Worried About Running Out of Food in the Last Year: Never true    Ran Out of Food in the Last Year: Never true  Transportation Needs: No Transportation Needs (02/26/2023)   PRAPARE - Administrator, Civil Service (Medical): No    Lack of Transportation (Non-Medical): No  Physical Activity: Sufficiently Active (02/26/2023)   Exercise Vital Sign    Days of Exercise per Week: 7 days    Minutes of Exercise per Session: 30 min  Stress: No Stress Concern Present (02/26/2023)   [employer] of Occupational Health - Occupational Stress Questionnaire    Feeling of Stress : Not at all  Social Connections: Socially Integrated (02/26/2023)   Social Connection and Isolation Panel [NHANES]    Frequency of Communication with Friends and Family: More than three times a week    Frequency of Social Gatherings with Friends and Family: More than three times a week    Attends Religious Services: More than 4 times per year    Active Member of Clubs or  Organizations: Yes    Attends Engineer, structural: More than 4 times per year    Marital Status: Married  Catering manager Violence: Not At Risk (02/26/2023)   Humiliation, Afraid, Rape, and Kick questionnaire    Fear of Current or Ex-Partner: No    Emotionally Abused: No    Physically Abused: No    Sexually Abused: No    Past Surgical History:  Procedure Laterality Date   APPENDECTOMY     HERNIA REPAIR     inguinal-not sure of side   TONSILLECTOMY AND ADENOIDECTOMY  1952   VASECTOMY      Family History  Problem Relation Age of Onset   Heart disease Mother        CABG x2 at 68. Surgery for "enlarged heart" later   Hyperlipidemia Mother    Diabetes Father    Alzheimer's disease Father    Colon cancer Brother        in 27s   Colon  cancer Sister        21   ALS Brother        other brother   Prostate cancer Neg Hx     No Known Allergies  Current Outpatient Medications on File Prior to Visit  Medication Sig Dispense Refill   atorvastatin (LIPITOR) 20 MG tablet TAKE 1 TABLET BY MOUTH EVERYDAY AT BEDTIME 90 tablet 3   ciclopirox (PENLAC) 8 % solution Apply topically at bedtime. Apply over nail and surrounding skin. Apply daily over previous coat. After seven (7) days, may remove with alcohol and continue cycle. Use for daily for 1 year. 6.6 mL 11   doxycycline (VIBRAMYCIN) 100 MG capsule Take 100 mg by mouth 2 (two) times daily.     naproxen (NAPROSYN) 250 MG tablet Take 250 mg by mouth 2 (two) times daily with a meal.     Omega-3 Krill Oil 300 MG CAPS Take 1 capsule (300 mg total) by mouth 2 (two) times daily.     scopolamine (TRANSDERM-SCOP) 1 MG/3DAYS Place 1 patch (1.5 mg total) onto the skin every 3 (three) days. 10 patch 0   No current facility-administered medications on file prior to visit.    BP 120/82   Pulse 72   Temp 98 F (36.7 C) (Oral)   Ht 5' 9.75" (1.772 m)   Wt 275 lb (124.7 kg)   SpO2 99%   BMI 39.74 kg/m       Objective:   Physical Exam Vitals and nursing note reviewed.  Constitutional:      Appearance: Normal appearance.  Musculoskeletal:        General: Tenderness present. Normal range of motion.     Right ankle: Normal.     Left ankle: Swelling (along medial malleolus) present. Tenderness present over the lateral malleolus. Normal pulse.     Left Achilles Tendon: Normal.     Comments: No calf pain, redness or tenderness.   Skin:    General: Skin is warm and dry.     Findings: No abrasion, bruising, erythema or rash.  Neurological:     General: No focal deficit present.     Mental Status: He is alert and oriented to person, place, and time.  Psychiatric:        Mood and Affect: Mood normal.        Behavior: Behavior normal.        Thought Content: Thought content normal.         Judgment: Judgment normal.  Assessment & Plan:  1. Acute left ankle pain (Primary) - Since he has been taking Motrin it is hard to tell if this is gout. Will do xray today and check uric acid level. No concern for DVT. Could be arthritic.  - DG Ankle Complete Left; Future - Uric Acid; Future  Time spent with patient today was 32  minutes which consisted of chart review, discussing  left ankle pain, gout and strains, work up, treatment answering questions and documentation.

## 2023-06-27 NOTE — Telephone Encounter (Signed)
 FYI

## 2023-07-17 ENCOUNTER — Encounter: Payer: Self-pay | Admitting: Adult Health

## 2023-11-11 DIAGNOSIS — L814 Other melanin hyperpigmentation: Secondary | ICD-10-CM | POA: Diagnosis not present

## 2023-11-11 DIAGNOSIS — L821 Other seborrheic keratosis: Secondary | ICD-10-CM | POA: Diagnosis not present

## 2023-11-11 DIAGNOSIS — Z85828 Personal history of other malignant neoplasm of skin: Secondary | ICD-10-CM | POA: Diagnosis not present

## 2023-11-11 DIAGNOSIS — D2271 Melanocytic nevi of right lower limb, including hip: Secondary | ICD-10-CM | POA: Diagnosis not present

## 2023-11-11 DIAGNOSIS — D485 Neoplasm of uncertain behavior of skin: Secondary | ICD-10-CM | POA: Diagnosis not present

## 2023-12-25 DIAGNOSIS — D125 Benign neoplasm of sigmoid colon: Secondary | ICD-10-CM | POA: Diagnosis not present

## 2023-12-25 DIAGNOSIS — Z860101 Personal history of adenomatous and serrated colon polyps: Secondary | ICD-10-CM | POA: Diagnosis not present

## 2023-12-25 DIAGNOSIS — Z1211 Encounter for screening for malignant neoplasm of colon: Secondary | ICD-10-CM | POA: Diagnosis not present

## 2023-12-25 DIAGNOSIS — K635 Polyp of colon: Secondary | ICD-10-CM | POA: Diagnosis not present

## 2024-02-20 ENCOUNTER — Other Ambulatory Visit: Payer: Self-pay | Admitting: Adult Health

## 2024-02-20 DIAGNOSIS — E785 Hyperlipidemia, unspecified: Secondary | ICD-10-CM

## 2024-03-22 ENCOUNTER — Ambulatory Visit

## 2024-03-22 NOTE — Progress Notes (Signed)
 Jermaine Johnson

## 2024-03-30 ENCOUNTER — Ambulatory Visit: Admitting: Adult Health

## 2024-03-30 ENCOUNTER — Encounter: Payer: Self-pay | Admitting: Adult Health

## 2024-03-30 VITALS — BP 130/84 | HR 60 | Temp 98.4°F | Ht 68.25 in | Wt 271.0 lb

## 2024-03-30 DIAGNOSIS — Z0000 Encounter for general adult medical examination without abnormal findings: Secondary | ICD-10-CM

## 2024-03-30 DIAGNOSIS — E66813 Obesity, class 3: Secondary | ICD-10-CM

## 2024-03-30 DIAGNOSIS — Z6841 Body Mass Index (BMI) 40.0 and over, adult: Secondary | ICD-10-CM | POA: Diagnosis not present

## 2024-03-30 DIAGNOSIS — E785 Hyperlipidemia, unspecified: Secondary | ICD-10-CM

## 2024-03-30 DIAGNOSIS — Z125 Encounter for screening for malignant neoplasm of prostate: Secondary | ICD-10-CM

## 2024-03-30 LAB — LIPID PANEL
Cholesterol: 154 mg/dL (ref 0–200)
HDL: 41.9 mg/dL (ref >39.00)
LDL Cholesterol: 77 mg/dL (ref 0–99)
NonHDL: 111.95
Total CHOL/HDL Ratio: 4
Triglycerides: 177 mg/dL — ABNORMAL HIGH (ref 0.0–149.0)
VLDL: 35.4 mg/dL (ref 0.0–40.0)

## 2024-03-30 LAB — COMPREHENSIVE METABOLIC PANEL WITH GFR
ALT: 24 U/L (ref 0–53)
AST: 18 U/L (ref 0–37)
Albumin: 4.3 g/dL (ref 3.5–5.2)
Alkaline Phosphatase: 89 U/L (ref 39–117)
BUN: 22 mg/dL (ref 6–23)
CO2: 30 meq/L (ref 19–32)
Calcium: 9.5 mg/dL (ref 8.4–10.5)
Chloride: 104 meq/L (ref 96–112)
Creatinine, Ser: 0.95 mg/dL (ref 0.40–1.50)
GFR: 81.12 mL/min (ref >60.00)
Glucose, Bld: 84 mg/dL (ref 70–99)
Potassium: 4.7 meq/L (ref 3.5–5.1)
Sodium: 140 meq/L (ref 135–145)
Total Bilirubin: 0.7 mg/dL (ref 0.2–1.2)
Total Protein: 6.8 g/dL (ref 6.0–8.3)

## 2024-03-30 LAB — PSA: PSA: 5.77 ng/mL — ABNORMAL HIGH (ref 0.10–4.00)

## 2024-03-30 LAB — CBC
HCT: 40.3 % (ref 39.0–52.0)
Hemoglobin: 13.5 g/dL (ref 13.0–17.0)
MCHC: 33.5 g/dL (ref 30.0–36.0)
MCV: 86.7 fl (ref 78.0–100.0)
Platelets: 175 K/uL (ref 150.0–400.0)
RBC: 4.64 Mil/uL (ref 4.22–5.81)
RDW: 14.3 % (ref 11.5–15.5)
WBC: 7.7 K/uL (ref 4.0–10.5)

## 2024-03-30 LAB — TSH: TSH: 1.34 u[IU]/mL (ref 0.35–5.50)

## 2024-03-30 NOTE — Patient Instructions (Signed)
 It was great seeing you today   We will follow up with you regarding your lab work   Please let me know if you need anything

## 2024-03-30 NOTE — Progress Notes (Signed)
 Subjective:    Patient ID: Jermaine Johnson, male    DOB: Apr 17, 1956, 68 y.o.   MRN: [id number]  HPI Patient presents for yearly preventative medicine examination. He is a pleasant 68 year old male who  has a past medical history of Allergy, CALCANEAL SPUR, RIGHT (03/20/2010), History of colon polyps, Migraine, ONYCHOMYCOSIS (11/12/2007), and ROTATOR CUFF INJURY, LEFT SHOULDER (07/29/2007).  Hyperlipidemia -  Managed with Lipitor 20 mg daily.  Lab Results  Component Value Date   CHOL 207 (H) 01/07/2023   HDL 40.00 01/07/2023   LDLCALC 130 (H) 01/07/2023   LDLDIRECT 99.2 01/18/2013   TRIG 188.0 (H) 01/07/2023   CHOLHDL 5 01/07/2023   Obesity - he has been doing some exercise and stretch. He does try and eat healthy.   Wt Readings from Last 3 Encounters:  03/30/24 271 lb (122.9 kg)  03/22/24 275 lb (124.7 kg)  06/26/23 275 lb (124.7 kg)    All immunizations and health maintenance protocols were reviewed with the patient and needed orders were placed. He is up to date on routine vaccinations.   Appropriate screening laboratory values were ordered for the patient including screening of hyperlipidemia, renal function and hepatic function. If indicated by BPH, a PSA was ordered.  Medication reconciliation,  past medical history, social history, problem list and allergies were reviewed in detail with the patient  Goals were established with regard to weight loss, exercise, and  diet in compliance with medications  He is up to date on routine colon cancer screening    Review of Systems  Constitutional: Negative.   HENT: Negative.    Eyes: Negative.   Respiratory: Negative.    Cardiovascular: Negative.   Gastrointestinal: Negative.   Endocrine: Negative.   Genitourinary: Negative.   Musculoskeletal:  Positive for arthralgias and back pain.  Skin: Negative.   Allergic/Immunologic: Negative.   Neurological: Negative.   Hematological: Negative.   Psychiatric/Behavioral: Negative.     All other systems reviewed and are negative.  Past Medical History:  Diagnosis Date   Allergy    CALCANEAL SPUR, RIGHT 03/20/2010   History of colon polyps    Migraine    ONYCHOMYCOSIS 11/12/2007   ROTATOR CUFF INJURY, LEFT SHOULDER 07/29/2007    Social History   Socioeconomic History   Marital status: Married    Spouse name: Not on file   Number of children: 2   Years of education: Not on file   Highest education level: Associate degree: academic program  Occupational History   Occupation: retired-IT   Tobacco Use   Smoking status: Never   Smokeless tobacco: Never   Tobacco comments:     No tobacco   Substance and Sexual Activity   Alcohol use: Not Currently    Comment: socially   Drug use: Never   Sexual activity: Yes  Other Topics Concern   Not on file  Social History Narrative   Married (wife Nussen Pullin ); household includes his daughter    2 children-michael  katie      Retired  CONSULTING CIVIL ENGINEER support, networking   Hobbies: build stuff, sail, artist-painting, drawing, sculpture, pencil.    Social Drivers of Corporate Investment Banker Strain: Low Risk  (02/26/2023)   Overall Financial Resource Strain (CARDIA)    Difficulty of Paying Living Expenses: Not hard at all  Food Insecurity: No Food Insecurity (02/26/2023)   Hunger Vital Sign    Worried About Running Out of Food in the Last Year: Never true  Ran Out of Food in the Last Year: Never true  Transportation Needs: No Transportation Needs (02/26/2023)   PRAPARE - Administrator, Civil Service (Medical): No    Lack of Transportation (Non-Medical): No  Physical Activity: Sufficiently Active (02/26/2023)   Exercise Vital Sign    Days of Exercise per Week: 7 days    Minutes of Exercise per Session: 30 min  Stress: No Stress Concern Present (02/26/2023)   [employer] of Occupational Health - Occupational Stress Questionnaire    Feeling of Stress : Not at all  Social Connections: Socially Integrated  (02/26/2023)   Social Connection and Isolation Panel    Frequency of Communication with Friends and Family: More than three times a week    Frequency of Social Gatherings with Friends and Family: More than three times a week    Attends Religious Services: More than 4 times per year    Active Member of [employer] or Organizations: Yes    Attends Engineer, Structural: More than 4 times per year    Marital Status: Married  Catering Manager Violence: Not At Risk (02/26/2023)   Humiliation, Afraid, Rape, and Kick questionnaire    Fear of Current or Ex-Partner: No    Emotionally Abused: No    Physically Abused: No    Sexually Abused: No    Past Surgical History:  Procedure Laterality Date   APPENDECTOMY     HERNIA REPAIR     inguinal-not sure of side   TONSILLECTOMY AND ADENOIDECTOMY  1952   VASECTOMY      Family History  Problem Relation Age of Onset   Heart disease Mother        CABG x2 at 77. Surgery for enlarged heart later   Hyperlipidemia Mother    Diabetes Father    Alzheimer's disease Father    Colon cancer Sister        61   Colon cancer Brother        in 5s   ALS Brother        other brother   Heart disease Brother    Diabetes Brother    Healthy Brother    Diabetes Half-Brother    Prostate cancer Neg Hx     No Known Allergies  Current Outpatient Medications on File Prior to Visit  Medication Sig Dispense Refill   atorvastatin  (LIPITOR) 20 MG tablet TAKE 1 TABLET BY MOUTH EVERYDAY AT BEDTIME 90 tablet 3   ciclopirox  (PENLAC ) 8 % solution Apply topically at bedtime. Apply over nail and surrounding skin. Apply daily over previous coat. After seven (7) days, may remove with alcohol and continue cycle. Use for daily for 1 year. 6.6 mL 11   doxycycline (VIBRAMYCIN) 100 MG capsule Take 100 mg by mouth 2 (two) times daily.     ibuprofen  (ADVIL ) 800 MG tablet Take 1 tablet (800 mg total) by mouth every 8 (eight) hours as needed. 30 tablet 1   naproxen (NAPROSYN)  250 MG tablet Take 250 mg by mouth 2 (two) times daily with a meal.     Omega-3 Krill Oil 300 MG CAPS Take 1 capsule (300 mg total) by mouth 2 (two) times daily.     scopolamine  (TRANSDERM-SCOP) 1 MG/3DAYS Place 1 patch (1.5 mg total) onto the skin every 3 (three) days. 10 patch 0   No current facility-administered medications on file prior to visit.    BP (!) 160/80   Pulse 60   Temp 98.4 F (36.9 C) (  Oral)   Ht 5' 8.25 (1.734 m)   Wt 271 lb (122.9 kg)   SpO2 98%   BMI 40.90 kg/m       Objective:   Physical Exam Vitals and nursing note reviewed.  Constitutional:      General: He is not in acute distress.    Appearance: Normal appearance. He is obese. He is not ill-appearing.  HENT:     Head: Normocephalic and atraumatic.     Right Ear: Tympanic membrane, ear canal and external ear normal. There is no impacted cerumen.     Left Ear: Tympanic membrane, ear canal and external ear normal. There is no impacted cerumen.     Nose: Nose normal. No congestion or rhinorrhea.     Mouth/Throat:     Mouth: Mucous membranes are moist.     Pharynx: Oropharynx is clear.  Eyes:     Extraocular Movements: Extraocular movements intact.     Conjunctiva/sclera: Conjunctivae normal.     Pupils: Pupils are equal, round, and reactive to light.  Neck:     Vascular: No carotid bruit.  Cardiovascular:     Rate and Rhythm: Normal rate and regular rhythm.     Pulses: Normal pulses.     Heart sounds: No murmur heard.    No friction rub. No gallop.  Pulmonary:     Effort: Pulmonary effort is normal.     Breath sounds: Normal breath sounds.  Abdominal:     General: Abdomen is flat. Bowel sounds are normal. There is no distension.     Palpations: Abdomen is soft. There is no mass.     Tenderness: There is no abdominal tenderness. There is no guarding or rebound.     Hernia: No hernia is present.  Musculoskeletal:        General: Normal range of motion.     Cervical back: Normal range of motion  and neck supple.  Lymphadenopathy:     Cervical: No cervical adenopathy.  Skin:    General: Skin is warm and dry.     Capillary Refill: Capillary refill takes less than 2 seconds.  Neurological:     General: No focal deficit present.     Mental Status: He is alert and oriented to person, place, and time.  Psychiatric:        Mood and Affect: Mood normal.        Behavior: Behavior normal.        Thought Content: Thought content normal.        Judgment: Judgment normal.        Assessment & Plan:   1. Routine general medical examination at a health care facility (Primary) Today patient counseled on age appropriate routine health concerns for screening and prevention, each reviewed and up to date or declined. Immunizations reviewed and up to date or declined. Labs ordered and reviewed. Risk factors for depression reviewed and negative. Hearing function and visual acuity are intact. ADLs screened and addressed as needed. Functional ability and level of safety reviewed and appropriate. Education, counseling and referrals performed based on assessed risks today. Patient provided with a copy of personalized plan for preventive services. - Follow up in one year or sooner if needed   2. Dyslipidemia - Consider increase in statin  - Lipid panel; Future - TSH; Future - CBC; Future - Comprehensive metabolic panel with GFR; Future  3. Class 3 severe obesity due to excess calories without serious comorbidity with body mass index (BMI) of 40.0  to 44.9 in adult [facility]) - Work on weight loss through diet and exercise - Lipid panel; Future - TSH; Future - CBC; Future - Comprehensive metabolic panel with GFR; Future  4. Prostate cancer screening  - PSA; Future  Darleene Shape, NP

## 2024-03-31 ENCOUNTER — Ambulatory Visit: Payer: Self-pay | Admitting: Adult Health

## 2024-03-31 DIAGNOSIS — R972 Elevated prostate specific antigen [PSA]: Secondary | ICD-10-CM

## 2024-05-18 ENCOUNTER — Encounter: Payer: Self-pay | Admitting: Adult Health

## 2024-05-18 NOTE — Telephone Encounter (Signed)
 Pt has been scheduled for repeat labs.

## 2024-05-19 ENCOUNTER — Other Ambulatory Visit (INDEPENDENT_AMBULATORY_CARE_PROVIDER_SITE_OTHER)

## 2024-05-19 ENCOUNTER — Ambulatory Visit: Payer: Self-pay | Admitting: Adult Health

## 2024-05-19 DIAGNOSIS — R972 Elevated prostate specific antigen [PSA]: Secondary | ICD-10-CM

## 2024-05-19 LAB — PSA: PSA: 5.03 ng/mL — ABNORMAL HIGH (ref 0.10–4.00)

## 2024-06-01 ENCOUNTER — Encounter: Payer: Self-pay | Admitting: Adult Health

## 2025-03-31 ENCOUNTER — Encounter: Admitting: Adult Health
